# Patient Record
Sex: Female | Born: 1980 | Race: Black or African American | Hispanic: No | Marital: Single | Smoking: Never smoker
Health system: Southern US, Community
[De-identification: ages and names within clinical notes are randomized; demographics above are authoritative.]

## PROBLEM LIST (undated history)

## (undated) ENCOUNTER — Inpatient Hospital Stay (HOSPITAL_COMMUNITY): Payer: Self-pay

## (undated) DIAGNOSIS — B1910 Unspecified viral hepatitis B without hepatic coma: Secondary | ICD-10-CM

---

## 2015-05-25 NOTE — L&D Delivery Note (Signed)
Patient is a 35 y.o. now [id number] who presented with SOL, now s/p NSVD.   Delivery Note At 8:58 AM a viable female was delivered. Mother complete and pushing. Head delivered OA, with restitution to the left. Shoulder and body delivered in the usual fashion. Cord clamped and cut after 1 minute delay. Placenta delivered intact. Fundus firm. No lacerations noted.   APGAR: pending Weight : pending Placenta status: intact Cord: 3-vessel  Anesthesia:  n/a Episiotomy:  none Lacerations:  none Est. Blood Loss (mL):  100  Mom to postpartum.  Baby to Couplet care / Skin to Skin.  Kandra NicolasJulie P Degele 01/29/2016, 9:26 AM  Patient is a [id number] at 7187w2d who was admitted in active labor/transition, significant hx of prev C/S followed by VBAC x 2 and last onset of care @ 31wks. She progressed to SVD without augmentation, but membranes were AROMed prior to del.  I was gloved and present for delivery in its entirety.  Second stage of labor progressed, baby delivered after two contractions.  No decels during second stage noted.  Complications: none  Lacerations: none  EBL: 100cc  Given prophylactic cytotec 800mcg buccally.  Cam HaiSHAW, KIMBERLY, CNM 9:59 AM 01/29/2016

## 2015-06-14 ENCOUNTER — Emergency Department (HOSPITAL_COMMUNITY)
Admission: EM | Admit: 2015-06-14 | Discharge: 2015-06-15 | Disposition: A | Discharge status: Home / Self Care | Payer: Medicaid Other | Attending: Emergency Medicine | Admitting: Emergency Medicine

## 2015-06-14 ENCOUNTER — Encounter (HOSPITAL_COMMUNITY): Payer: Self-pay | Admitting: Emergency Medicine

## 2015-06-14 DIAGNOSIS — Z8619 Personal history of other infectious and parasitic diseases: Secondary | ICD-10-CM | POA: Diagnosis not present

## 2015-06-14 DIAGNOSIS — O9989 Other specified diseases and conditions complicating pregnancy, childbirth and the puerperium: Secondary | ICD-10-CM | POA: Insufficient documentation

## 2015-06-14 DIAGNOSIS — Z3490 Encounter for supervision of normal pregnancy, unspecified, unspecified trimester: Secondary | ICD-10-CM

## 2015-06-14 DIAGNOSIS — R1030 Lower abdominal pain, unspecified: Secondary | ICD-10-CM | POA: Diagnosis not present

## 2015-06-14 DIAGNOSIS — R102 Pelvic and perineal pain: Secondary | ICD-10-CM

## 2015-06-14 DIAGNOSIS — Z3A01 Less than 8 weeks gestation of pregnancy: Secondary | ICD-10-CM | POA: Diagnosis not present

## 2015-06-14 HISTORY — DX: Unspecified viral hepatitis B without hepatic coma: B19.10

## 2015-06-14 LAB — COMPREHENSIVE METABOLIC PANEL
ALBUMIN: 3.7 g/dL (ref 3.5–5.0)
ALK PHOS: 82 U/L (ref 38–126)
ALT: 11 U/L — AB (ref 14–54)
ANION GAP: 11 (ref 5–15)
AST: 16 U/L (ref 15–41)
BILIRUBIN TOTAL: 0.5 mg/dL (ref 0.3–1.2)
BUN: 11 mg/dL (ref 6–20)
CALCIUM: 9 mg/dL (ref 8.9–10.3)
CO2: 24 mmol/L (ref 22–32)
Chloride: 102 mmol/L (ref 101–111)
Creatinine, Ser: 0.84 mg/dL (ref 0.44–1.00)
GFR calc Af Amer: >60 mL/min (ref >60)
GFR calc non Af Amer: >60 mL/min (ref >60)
GLUCOSE: 101 mg/dL — AB (ref 65–99)
Potassium: 4.4 mmol/L (ref 3.5–5.1)
Sodium: 137 mmol/L (ref 135–145)
TOTAL PROTEIN: 6.9 g/dL (ref 6.5–8.1)

## 2015-06-14 LAB — URINALYSIS, ROUTINE W REFLEX MICROSCOPIC
Bilirubin Urine: NEGATIVE
GLUCOSE, UA: NEGATIVE mg/dL
Hgb urine dipstick: NEGATIVE
KETONES UR: NEGATIVE mg/dL
LEUKOCYTES UA: NEGATIVE
Nitrite: NEGATIVE
PH: 6 (ref 5.0–8.0)
Protein, ur: NEGATIVE mg/dL
Specific Gravity, Urine: 1.014 (ref 1.005–1.030)

## 2015-06-14 LAB — CBC WITH DIFFERENTIAL/PLATELET
BASOS PCT: 1 %
Basophils Absolute: 0 10*3/uL (ref 0.0–0.1)
Eosinophils Absolute: 0.9 10*3/uL — ABNORMAL HIGH (ref 0.0–0.7)
Eosinophils Relative: 11 %
HEMATOCRIT: 37.3 % (ref 36.0–46.0)
Hemoglobin: 12.8 g/dL (ref 12.0–15.0)
LYMPHS ABS: 3 10*3/uL (ref 0.7–4.0)
LYMPHS PCT: 38 %
MCH: 28.4 pg (ref 26.0–34.0)
MCHC: 34.3 g/dL (ref 30.0–36.0)
MCV: 82.7 fL (ref 78.0–100.0)
MONO ABS: 0.3 10*3/uL (ref 0.1–1.0)
MONOS PCT: 4 %
NEUTROS ABS: 3.6 10*3/uL (ref 1.7–7.7)
Neutrophils Relative %: 46 %
Platelets: 243 10*3/uL (ref 150–400)
RBC: 4.51 MIL/uL (ref 3.87–5.11)
RDW: 14.1 % (ref 11.5–15.5)
WBC: 7.8 10*3/uL (ref 4.0–10.5)

## 2015-06-14 LAB — POC URINE PREG, ED: PREG TEST UR: POSITIVE — AB

## 2015-06-14 LAB — I-STAT BETA HCG BLOOD, ED (MC, WL, AP ONLY): I-stat hCG, quantitative: >2000 m[IU]/mL — ABNORMAL HIGH (ref <5)

## 2015-06-14 LAB — HCG, QUANTITATIVE, PREGNANCY: hCG, Beta Chain, Quant, S: 31390 m[IU]/mL — ABNORMAL HIGH (ref <5)

## 2015-06-14 NOTE — ED Notes (Signed)
Per EMS, the patient is feeling weak, sudden onset. "feels week all over, but most weakness is in pelvic area". lmp was dec 13th. VS 114/78, p 82, rr 18, 99% o2 on room air, cbg is 99. Patient is from home. Patient speaks swahili.

## 2015-06-14 NOTE — ED Provider Notes (Signed)
CSN: [id number]     Arrival date & time 06/14/15  2127 History   First MD Initiated Contact with Patient 06/14/15 2147     Chief Complaint  Patient presents with  . Abdominal Pain     (Consider location/radiation/quality/duration/timing/severity/associated sxs/prior Treatment) Patient is a 35 y.o. female presenting with abdominal pain. The history is provided by the patient. The history is limited by a language barrier. A language interpreter was used.  Abdominal Pain Pain location:  Suprapubic, LLQ and RLQ Pain quality: aching   Pain radiates to:  Does not radiate Pain severity:  Moderate Onset quality:  Gradual Duration:  1 day Timing:  Constant Progression:  Unchanged Chronicity:  New Context: not recent illness, not sick contacts and not trauma   Relieved by:  None tried Worsened by:  Nothing tried Ineffective treatments:  None tried Associated symptoms: no constipation, no diarrhea, no nausea, no shortness of breath, no vaginal bleeding, no vaginal discharge and no vomiting   Risk factors comment:  LMP Dec 13   Past Medical History  Diagnosis Date  . Hepatitis B    Past Surgical History  Procedure Laterality Date  . Cesarean section     History reviewed. No pertinent family history. Social History  Substance Use Topics  . Smoking status: Never Smoker   . Smokeless tobacco: None  . Alcohol Use: No   OB History    No data available     Review of Systems  Constitutional: Negative.   HENT: Negative.   Eyes: Negative.   Respiratory: Negative for shortness of breath.   Cardiovascular: Negative.   Gastrointestinal: Positive for abdominal pain. Negative for nausea, vomiting, diarrhea and constipation.  Genitourinary: Negative.  Negative for vaginal bleeding and vaginal discharge.  Musculoskeletal: Negative.   Skin: Negative for pallor, rash and wound.  Neurological: Negative.       Allergies  Asa  Home Medications   Prior to Admission medications    Not on File   BP 153/98 mmHg  Pulse 104  Temp(Src) 98.8 F (37.1 C) (Oral)  Resp 20  SpO2 98%  LMP 05/06/2015 (Exact Date) Physical Exam  Constitutional: She is oriented to person, place, and time. She appears well-developed and well-nourished. No distress.  HENT:  Head: Normocephalic and atraumatic.  Mouth/Throat: No oropharyngeal exudate.  Eyes: Conjunctivae are normal. Pupils are equal, round, and reactive to light. No scleral icterus.  Neck: Normal range of motion. Neck supple.  Cardiovascular: Normal rate, regular rhythm and intact distal pulses.  Exam reveals friction rub. Exam reveals no gallop.   No murmur heard. Pulmonary/Chest: Effort normal and breath sounds normal. No respiratory distress. She has no wheezes. She has no rales. She exhibits no tenderness.  Abdominal: Soft. She exhibits no distension. There is tenderness (mild suprapubic ttp). There is no rebound and no guarding.  Musculoskeletal: Normal range of motion. She exhibits no edema or tenderness.  Neurological: She is alert and oriented to person, place, and time. No cranial nerve deficit. She exhibits normal muscle tone. Coordination normal.  Skin: Skin is warm and dry. No rash noted. She is not diaphoretic. No erythema. No pallor.  Psychiatric: She has a normal mood and affect.  Nursing note and vitals reviewed.   ED Course  Procedures (including critical care time) Labs Review Labs Reviewed  CBC WITH DIFFERENTIAL/PLATELET - Abnormal; Notable for the following:    Eosinophils Absolute 0.9 (*)    All other components within normal limits  COMPREHENSIVE METABOLIC PANEL - Abnormal;  Notable for the following:    Glucose, Bld 101 (*)    ALT 11 (*)    All other components within normal limits  HCG, QUANTITATIVE, PREGNANCY - Abnormal; Notable for the following:    hCG, Beta Chain, Quant, S 31390 (*)    All other components within normal limits  I-STAT BETA HCG BLOOD, ED ([facility], [facility], [facility] ONLY) - Abnormal; Notable  for the following:    I-stat hCG, quantitative >2000.0 (*)    All other components within normal limits  POC URINE PREG, ED - Abnormal; Notable for the following:    Preg Test, Ur POSITIVE (*)    All other components within normal limits  URINALYSIS, ROUTINE W REFLEX MICROSCOPIC (NOT AT [facility])    Imaging Review US Ob Comp Less 14 Wks  06/15/2015  CLINICAL DATA:  Acute onset of pelvic pain.  Initial encounter. EXAM: OBSTETRIC <14 WK [place] AND TRANSVAGINAL OB US TECHNIQUE: Both transabdominal and transvaginal ultrasound examinations were performed for complete evaluation of the gestation as well as the maternal uterus, adnexal regions, and pelvic cul-de-sac. Transvaginal technique was performed to assess early pregnancy. COMPARISON:  None. FINDINGS: Intrauterine gestational sac: Visualized/normal in shape. Yolk sac:  No Embryo:  No MSD: 1.45 cm   6 w   2  d Subchorionic hemorrhage: A small amount of subchorionic hemorrhage is noted. Maternal uterus/adnexae: The right ovary is unremarkable in appearance. The left ovary is not seen. Trace free fluid within the pelvic cul-de-sac is likely physiologic in nature. IMPRESSION: 1. Intrauterine gestational sac noted, with a mean sac diameter of 1.5 cm, corresponding to a gestational age of [redacted] weeks 2 days. This matches the gestational age of [redacted] weeks 5 days by LMP, reflecting an estimated date of delivery of February 10, 2016. No yolk sac or embryo is yet seen. Would perform follow-up pelvic ultrasound in 2 weeks, if quantitative beta HCG level continues to trend upward. 2. Small amount of subchorionic hemorrhage noted. 3. No evidence for ectopic pregnancy.  The left ovary is not seen. Electronically Signed   By: Roanna Raider M.D.   On: 06/15/2015 01:54   US Ob Transvaginal  06/15/2015  CLINICAL DATA:  Acute onset of pelvic pain.  Initial encounter. EXAM: OBSTETRIC <14 WK [place] AND TRANSVAGINAL OB US TECHNIQUE: Both transabdominal and transvaginal ultrasound  examinations were performed for complete evaluation of the gestation as well as the maternal uterus, adnexal regions, and pelvic cul-de-sac. Transvaginal technique was performed to assess early pregnancy. COMPARISON:  None. FINDINGS: Intrauterine gestational sac: Visualized/normal in shape. Yolk sac:  No Embryo:  No MSD: 1.45 cm   6 w   2  d Subchorionic hemorrhage: A small amount of subchorionic hemorrhage is noted. Maternal uterus/adnexae: The right ovary is unremarkable in appearance. The left ovary is not seen. Trace free fluid within the pelvic cul-de-sac is likely physiologic in nature. IMPRESSION: 1. Intrauterine gestational sac noted, with a mean sac diameter of 1.5 cm, corresponding to a gestational age of [redacted] weeks 2 days. This matches the gestational age of [redacted] weeks 5 days by LMP, reflecting an estimated date of delivery of February 10, 2016. No yolk sac or embryo is yet seen. Would perform follow-up pelvic ultrasound in 2 weeks, if quantitative beta HCG level continues to trend upward. 2. Small amount of subchorionic hemorrhage noted. 3. No evidence for ectopic pregnancy.  The left ovary is not seen. Electronically Signed   By: Roanna Raider M.D.   On: 06/15/2015 01:54  I have personally reviewed and evaluated these images and lab results as part of my medical decision-making.   EKG Interpretation None      MDM   Final diagnoses:  Lower abdominal pain  Pregnancy    Patient is a 35 year old female who presents with lower abdominal aching pain for 1 day duration. The patient's last menstrual period was December 13. Patient denies any vaginal bleeding or vaginal discharge. Patient denies any urinary symptoms, cough, fever, chest pain, or diarrhea.. Further history and exam as above notable for stable vital signs and mild abdominal tenderness throughout her lower abdomen benign. Patient has a positive pregnancy test but otherwise no leukocytosis and reassuring CMP. UA negative for UTI.  Patient did not know she was pregnant and thus has not received any prenatal care. We'll obtain OB ultrasound to confirm IUP and dating purposes.  Patient care handed off to Dr. Patria Mane. Await ob US. Patient to f/u with OBGYN as outpatient for routine prenatal care if IUP confirmed.    Marijean Niemann, MD 06/15/15 1142  Lavera Guise, MD 06/15/15 1255

## 2015-06-15 ENCOUNTER — Encounter (HOSPITAL_COMMUNITY): Payer: Self-pay | Admitting: Emergency Medicine

## 2015-06-15 ENCOUNTER — Emergency Department (HOSPITAL_COMMUNITY): Payer: Medicaid Other

## 2015-06-15 NOTE — ED Notes (Signed)
Patient iin ultrasound.

## 2015-06-15 NOTE — ED Provider Notes (Signed)
4:03 AM Long discussion had with pt with interpreter. Early pregnancy. Gestational sac noted. No ectopic. No free fluid.  Patient will need follow-up beta hCG as well as follow-up ultrasound.  This was explained to the patient.  She understands to return to [facility] ER for any new or worsening symptoms.  Ectopic precautions given.  Told to take a prenatal vitamin.  All questions answered.  Azalia Bilis, MD 06/15/15 [phone]

## 2015-06-15 NOTE — ED Notes (Signed)
Called ultrasound for ETA, one other patient ahead of this patient.

## 2015-06-15 NOTE — ED Notes (Signed)
Ultrasound contacted again as family is getting agitated from wait time. Patient is next in transport.

## 2015-06-15 NOTE — ED Provider Notes (Signed)
I saw and evaluated the patient, reviewed the resident's note and I agree with the findings and plan.   EKG Interpretation None      35 year old female who presents with low abdominal pain and weakness. LMP in early December and without vaginal bleeding or discharge. No fever, N/V/D, or urinary complaints. VSS and abdomen soft and benign. Positive pregnancy test. Pending OB US to confirm IUP in setting of mild low abdominal tenderness. Will refer for outpatient OB follow-up if IUP  Lavera Guise, MD 06/15/15 [phone]

## 2015-06-15 NOTE — ED Notes (Signed)
Visitor at nursing station asking for update, explained MD is to preform pelvic exam, awaiting his return. They acknowledge. Discussed with Dr. Patria Mane.

## 2015-06-15 NOTE — Discharge Instructions (Signed)
Abdominal Pain, Adult Many things can cause abdominal pain. Usually, abdominal pain is not caused by a disease and will improve without treatment. It can often be observed and treated at home. Your health care provider will do a physical exam and possibly order blood tests and X-rays to help determine the seriousness of your pain. However, in many cases, more time must pass before a clear cause of the pain can be found. Before that point, your health care provider may not know if you need more testing or further treatment. HOME CARE INSTRUCTIONS Monitor your abdominal pain for any changes. The following actions may help to alleviate any discomfort you are experiencing:  Only take over-the-counter or prescription medicines as directed by your health care provider.  Do not take laxatives unless directed to do so by your health care provider.  Try a clear liquid diet (broth, tea, or water) as directed by your health care provider. Slowly move to a bland diet as tolerated. SEEK MEDICAL CARE IF:  You have unexplained abdominal pain.  You have abdominal pain associated with nausea or diarrhea.  You have pain when you urinate or have a bowel movement.  You experience abdominal pain that wakes you in the night.  You have abdominal pain that is worsened or improved by eating food.  You have abdominal pain that is worsened with eating fatty foods.  You have a fever. SEEK IMMEDIATE MEDICAL CARE IF:  Your pain does not go away within 2 hours.  You keep throwing up (vomiting).  Your pain is felt only in portions of the abdomen, such as the right side or the left lower portion of the abdomen.  You pass bloody or black tarry stools. MAKE SURE YOU:  Understand these instructions.  Will watch your condition.  Will get help right away if you are not doing well or get worse.   This information is not intended to replace advice given to you by your health care provider. Make sure you discuss  any questions you have with your health care provider.   Document Released: 02/17/2005 Document Revised: 01/29/2015 Document Reviewed: 01/17/2013 Elsevier Interactive Patient Education 2016 Elsevier Inc.  Back Pain in Pregnancy Back pain during pregnancy is common. It happens in about half of all pregnancies. It is important for you and your baby that you remain active during your pregnancy.If you feel that back pain is not allowing you to remain active or sleep well, it is time to see your caregiver. Back pain may be caused by several factors related to changes during your pregnancy.Fortunately, unless you had trouble with your back before your pregnancy, the pain is likely to get better after you deliver. Low back pain usually occurs between the fifth and seventh months of pregnancy. It can, however, happen in the first couple months. Factors that increase the risk of back problems include:   Previous back problems.  Injury to your back.  Having twins or multiple births.  A chronic cough.  Stress.  Job-related repetitive motions.  Muscle or spinal disease in the back.  Family history of back problems, ruptured (herniated) discs, or osteoporosis.  Depression, anxiety, and panic attacks. CAUSES   When you are pregnant, your body produces a hormone called relaxin. This hormonemakes the ligaments connecting the low back and pubic bones more flexible. This flexibility allows the baby to be delivered more easily. When your ligaments are loose, your muscles need to work harder to support your back. Soreness in your back can  come from tired muscles. Soreness can also come from back tissues that are irritated since they are receiving less support.  As the baby grows, it puts pressure on the nerves and blood vessels in your pelvis. This can cause back pain.  As the baby grows and gets heavier during pregnancy, the uterus pushes the stomach muscles forward and changes your center of  gravity. This makes your back muscles work harder to maintain good posture. SYMPTOMS  Lumbar pain during pregnancy Lumbar pain during pregnancy usually occurs at or above the waist in the center of the back. There may be pain and numbness that radiates into your leg or foot. This is similar to low back pain experienced by non-pregnant women. It usually increases with sitting for long periods of time, standing, or repetitive lifting. Tenderness may also be present in the muscles along your upper back. Posterior pelvic pain during pregnancy Pain in the back of the pelvis is more common than lumbar pain in pregnancy. It is a deep pain felt in your side at the waistline, or across the tailbone (sacrum), or in both places. You may have pain on one or both sides. This pain can also go into the buttocks and backs of the upper thighs. Pubic and groin pain may also be present. The pain does not quickly resolve with rest, and morning stiffness may also be present. Pelvic pain during pregnancy can be brought on by most activities. A high level of fitness before and during pregnancy may or may not prevent this problem. Labor pain is usually 1 to 2 minutes apart, lasts for about 1 minute, and involves a bearing down feeling or pressure in your pelvis. However, if you are at term with the pregnancy, constant low back pain can be the beginning of early labor, and you should be aware of this. DIAGNOSIS  X-rays of the back should not be done during the first 12 to 14 weeks of the pregnancy and only when absolutely necessary during the rest of the pregnancy. MRIs do not give off radiation and are safe during pregnancy. MRIs also should only be done when absolutely necessary. HOME CARE INSTRUCTIONS  Exercise as directed by your caregiver. Exercise is the most effective way to prevent or manage back pain. If you have a back problem, it is especially important to avoid sports that require sudden body movements. Swimming and  walking are great activities.  Do not stand in one place for long periods of time.  Do not wear high heels.  Sit in chairs with good posture. Use a pillow on your lower back if necessary. Make sure your head rests over your shoulders and is not hanging forward.  Try sleeping on your side, preferably the left side, with a pillow or two between your legs. If you are sore after a night's rest, your bedmay betoo soft.Try placing a board between your mattress and box spring.  Listen to your body when lifting.If you are experiencing pain, ask for help or try bending yourknees more so you can use your leg muscles rather than your back muscles. Squat down when picking up something from the floor. Do not bend over.  Eat a healthy diet. Try to gain weight within your caregiver's recommendations.  Use heat or cold packs 3 to 4 times a day for 15 minutes to help with the pain.  Only take over-the-counter or prescription medicines for pain, discomfort, or fever as directed by your caregiver. Sudden (acute) back pain  Use  bed rest for only the most extreme, acute episodes of back pain. Prolonged bed rest over 48 hours will aggravate your condition.  Ice is very effective for acute conditions.  Put ice in a plastic bag.  Place a towel between your skin and the bag.  Leave the ice on for 10 to 20 minutes every 2 hours, or as needed.  Using heat packs for 30 minutes prior to activities is also helpful. Continued back pain See your caregiver if you have continued problems. Your caregiver can help or refer you for appropriate physical therapy. With conditioning, most back problems can be avoided. Sometimes, a more serious issue may be the cause of back pain. You should be seen right away if new problems seem to be developing. Your caregiver may recommend:  A maternity girdle.  An elastic sling.  A back brace.  A massage therapist or acupuncture. SEEK MEDICAL CARE IF:   You are not able to  do most of your daily activities, even when taking the pain medicine you were given.  You need a referral to a physical therapist or chiropractor.  You want to try acupuncture. SEEK IMMEDIATE MEDICAL CARE IF:  You develop numbness, tingling, weakness, or problems with the use of your arms or legs.  You develop severe back pain that is no longer relieved with medicines.  You have a sudden change in bowel or bladder control.  You have increasing pain in other areas of the body.  You develop shortness of breath, dizziness, or fainting.  You develop nausea, vomiting, or sweating.  You have back pain which is similar to labor pains.  You have back pain along with your water breaking or vaginal bleeding.  You have back pain or numbness that travels down your leg.  Your back pain developed after you fell.  You develop pain on one side of your back. You may have a kidney stone.  You see blood in your urine. You may have a bladder infection or kidney stone.  You have back pain with blisters. You may have shingles. Back pain is fairly common during pregnancy but should not be accepted as just part of the process. Back pain should always be treated as soon as possible. This will make your pregnancy as pleasant as possible.   This information is not intended to replace advice given to you by your health care provider. Make sure you discuss any questions you have with your health care provider.   Document Released: 08/18/2005 Document Revised: 08/02/2011 Document Reviewed: 09/29/2010 Elsevier Interactive Patient Education 2016 [employer].  First Trimester of Pregnancy The first trimester of pregnancy is from week 1 until the end of week 12 (months 1 through 3). A week after a sperm fertilizes an egg, the egg will implant on the wall of the uterus. This embryo will begin to develop into a baby. Genes from you and your partner are forming the baby. The female genes determine whether the  baby is a boy or a girl. At 6-8 weeks, the eyes and face are formed, and the heartbeat can be seen on ultrasound. At the end of 12 weeks, all the baby's organs are formed.  Now that you are pregnant, you will want to do everything you can to have a healthy baby. Two of the most important things are to get good prenatal care and to follow your health care provider's instructions. Prenatal care is all the medical care you receive before the baby's birth. This care  will help prevent, find, and treat any problems during the pregnancy and childbirth. BODY CHANGES Your body goes through many changes during pregnancy. The changes vary from woman to woman.   You may gain or lose a couple of pounds at first.  You may feel sick to your stomach (nauseous) and throw up (vomit). If the vomiting is uncontrollable, call your health care provider.  You may tire easily.  You may develop headaches that can be relieved by medicines approved by your health care provider.  You may urinate more often. Painful urination may mean you have a bladder infection.  You may develop heartburn as a result of your pregnancy.  You may develop constipation because certain hormones are causing the muscles that push waste through your intestines to slow down.  You may develop hemorrhoids or swollen, bulging veins (varicose veins).  Your breasts may begin to grow larger and become tender. Your nipples may stick out more, and the tissue that surrounds them (areola) may become darker.  Your gums may bleed and may be sensitive to brushing and flossing.  Dark spots or blotches (chloasma, mask of pregnancy) may develop on your face. This will likely fade after the baby is born.  Your menstrual periods will stop.  You may have a loss of appetite.  You may develop cravings for certain kinds of food.  You may have changes in your emotions from day to day, such as being excited to be pregnant or being concerned that something may  go wrong with the pregnancy and baby.  You may have more vivid and strange dreams.  You may have changes in your hair. These can include thickening of your hair, rapid growth, and changes in texture. Some women also have hair loss during or after pregnancy, or hair that feels dry or thin. Your hair will most likely return to normal after your baby is born. WHAT TO EXPECT AT YOUR PRENATAL VISITS During a routine prenatal visit:  You will be weighed to make sure you and the baby are growing normally.  Your blood pressure will be taken.  Your abdomen will be measured to track your baby's growth.  The fetal heartbeat will be listened to starting around week 10 or 12 of your pregnancy.  Test results from any previous visits will be discussed. Your health care provider may ask you:  How you are feeling.  If you are feeling the baby move.  If you have had any abnormal symptoms, such as leaking fluid, bleeding, severe headaches, or abdominal cramping.  If you are using any tobacco products, including cigarettes, chewing tobacco, and electronic cigarettes.  If you have any questions. Other tests that may be performed during your first trimester include:  Blood tests to find your blood type and to check for the presence of any previous infections. They will also be used to check for low iron levels (anemia) and Rh antibodies. Later in the pregnancy, blood tests for diabetes will be done along with other tests if problems develop.  Urine tests to check for infections, diabetes, or protein in the urine.  An ultrasound to confirm the proper growth and development of the baby.  An amniocentesis to check for possible genetic problems.  Fetal screens for spina bifida and Down syndrome.  You may need other tests to make sure you and the baby are doing well.  HIV (human immunodeficiency virus) testing. Routine prenatal testing includes screening for HIV, unless you choose not to have this  test. HOME CARE INSTRUCTIONS  Medicines  Follow your health care provider's instructions regarding medicine use. Specific medicines may be either safe or unsafe to take during pregnancy.  Take your prenatal vitamins as directed.  If you develop constipation, try taking a stool softener if your health care provider approves. Diet  Eat regular, well-balanced meals. Choose a variety of foods, such as meat or vegetable-based protein, fish, milk and low-fat dairy products, vegetables, fruits, and whole grain breads and cereals. Your health care provider will help you determine the amount of weight gain that is right for you.  Avoid raw meat and uncooked cheese. These carry germs that can cause birth defects in the baby.  Eating four or five small meals rather than three large meals a day may help relieve nausea and vomiting. If you start to feel nauseous, eating a few soda crackers can be helpful. Drinking liquids between meals instead of during meals also seems to help nausea and vomiting.  If you develop constipation, eat more high-fiber foods, such as fresh vegetables or fruit and whole grains. Drink enough fluids to keep your urine clear or pale yellow. Activity and Exercise  Exercise only as directed by your health care provider. Exercising will help you:  Control your weight.  Stay in shape.  Be prepared for labor and delivery.  Experiencing pain or cramping in the lower abdomen or low back is a good sign that you should stop exercising. Check with your health care provider before continuing normal exercises.  Try to avoid standing for long periods of time. Move your legs often if you must stand in one place for a long time.  Avoid heavy lifting.  Wear low-heeled shoes, and practice good posture.  You may continue to have sex unless your health care provider directs you otherwise. Relief of Pain or Discomfort  Wear a good support bra for breast tenderness.   Take warm sitz  baths to soothe any pain or discomfort caused by hemorrhoids. Use hemorrhoid cream if your health care provider approves.   Rest with your legs elevated if you have leg cramps or low back pain.  If you develop varicose veins in your legs, wear support hose. Elevate your feet for 15 minutes, 3-4 times a day. Limit salt in your diet. Prenatal Care  Schedule your prenatal visits by the twelfth week of pregnancy. They are usually scheduled monthly at first, then more often in the last 2 months before delivery.  Write down your questions. Take them to your prenatal visits.  Keep all your prenatal visits as directed by your health care provider. Safety  Wear your seat belt at all times when driving.  Make a list of emergency phone numbers, including numbers for family, friends, the hospital, and police and fire departments. General Tips  Ask your health care provider for a referral to a local prenatal education class. Begin classes no later than at the beginning of month 6 of your pregnancy.  Ask for help if you have counseling or nutritional needs during pregnancy. Your health care provider can offer advice or refer you to specialists for help with various needs.  Do not use hot tubs, steam rooms, or saunas.  Do not douche or use tampons or scented sanitary pads.  Do not cross your legs for long periods of time.  Avoid cat litter boxes and soil used by cats. These carry germs that can cause birth defects in the baby and possibly loss of the fetus by miscarriage  or stillbirth.  Avoid all smoking, herbs, alcohol, and medicines not prescribed by your health care provider. Chemicals in these affect the formation and growth of the baby.  Do not use any tobacco products, including cigarettes, chewing tobacco, and electronic cigarettes. If you need help quitting, ask your health care provider. You may receive counseling support and other resources to help you quit.  Schedule a dentist  appointment. At home, brush your teeth with a soft toothbrush and be gentle when you floss. SEEK MEDICAL CARE IF:   You have dizziness.  You have mild pelvic cramps, pelvic pressure, or nagging pain in the abdominal area.  You have persistent nausea, vomiting, or diarrhea.  You have a bad smelling vaginal discharge.  You have pain with urination.  You notice increased swelling in your face, hands, legs, or ankles. SEEK IMMEDIATE MEDICAL CARE IF:   You have a fever.  You are leaking fluid from your vagina.  You have spotting or bleeding from your vagina.  You have severe abdominal cramping or pain.  You have rapid weight gain or loss.  You vomit blood or material that looks like coffee grounds.  You are exposed to [place] measles and have never had them.  You are exposed to fifth disease or chickenpox.  You develop a severe headache.  You have shortness of breath.  You have any kind of trauma, such as from a fall or a car accident.   This information is not intended to replace advice given to you by your health care provider. Make sure you discuss any questions you have with your health care provider.   Document Released: 05/04/2001 Document Revised: 05/31/2014 Document Reviewed: 03/20/2013 Elsevier Interactive Patient Education [employer]

## 2015-12-04 ENCOUNTER — Encounter (HOSPITAL_COMMUNITY): Payer: Self-pay | Admitting: *Deleted

## 2015-12-04 ENCOUNTER — Inpatient Hospital Stay (HOSPITAL_COMMUNITY)
Admission: AD | Admit: 2015-12-04 | Discharge: 2015-12-04 | Disposition: A | Discharge status: Home / Self Care | Payer: Medicaid Other | Source: Ambulatory Visit | Attending: Obstetrics & Gynecology | Admitting: Obstetrics & Gynecology

## 2015-12-04 DIAGNOSIS — Z8619 Personal history of other infectious and parasitic diseases: Secondary | ICD-10-CM | POA: Diagnosis not present

## 2015-12-04 DIAGNOSIS — R42 Dizziness and giddiness: Secondary | ICD-10-CM | POA: Insufficient documentation

## 2015-12-04 DIAGNOSIS — Z3A30 30 weeks gestation of pregnancy: Secondary | ICD-10-CM | POA: Diagnosis not present

## 2015-12-04 DIAGNOSIS — R51 Headache: Secondary | ICD-10-CM | POA: Diagnosis not present

## 2015-12-04 DIAGNOSIS — O9989 Other specified diseases and conditions complicating pregnancy, childbirth and the puerperium: Secondary | ICD-10-CM

## 2015-12-04 DIAGNOSIS — O26893 Other specified pregnancy related conditions, third trimester: Secondary | ICD-10-CM | POA: Insufficient documentation

## 2015-12-04 DIAGNOSIS — R519 Headache, unspecified: Secondary | ICD-10-CM

## 2015-12-04 LAB — URINALYSIS, ROUTINE W REFLEX MICROSCOPIC
BILIRUBIN URINE: NEGATIVE
Glucose, UA: NEGATIVE mg/dL
HGB URINE DIPSTICK: NEGATIVE
KETONES UR: NEGATIVE mg/dL
Leukocytes, UA: NEGATIVE
NITRITE: NEGATIVE
Protein, ur: NEGATIVE mg/dL
SPECIFIC GRAVITY, URINE: 1.02 (ref 1.005–1.030)
pH: 5.5 (ref 5.0–8.0)

## 2015-12-04 NOTE — MAU Note (Signed)
Provider at bedside to evaluate patient with interpreter via [employer]. All need discussed and understood by patient. FHR 130 and stable. No contractions noted. Plan is to d/c pt home with a work for note. Carmelina DaneERRI L Jakhiya Brower, RN

## 2015-12-04 NOTE — MAU Note (Signed)
Pt was dizzy at work on Monday. ([place] Was [facility] to [facility]. [facility] to "see a doctor for her pregnancy. " Work need a clearece that she can work again. Pt has no symptoms now.

## 2015-12-04 NOTE — Discharge Instructions (Signed)

## 2015-12-04 NOTE — MAU Provider Note (Signed)
Chief Complaint:  Dizziness   First Provider Initiated Contact with Patient 12/04/15 0809     HPI  Mariah Henson is a 35 y.o. G1P0 at 1630w2dwho presents to maternity admissions reporting need for return to work note. 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   [facility] and dizziness on Monday, was told to come here for prenatal care.  States has no symptoms whatsoever today.  Feels well. She reports good fetal movement, denies LOF, vaginal bleeding, vaginal itching/burning, urinary symptoms, h/a, dizziness, n/v, diarrhea, constipation or fever/chills.  She denies headache, visual changes or RUQ abdominal pain.  Video interpretor used (Swahili)  RN note: Pt received to mau c/o having a headache and dizziness on Monday and she came to the hospital for that visit. Pt denies pain at this time. Pt requesting a note for work and that is her only desire at this time. TERRI L TAEUBER Pt was dizzy at work on Monday. [place] [facility] to be [facility]. [facility] to "see a doctor for her pregnancy. " Work need a clearece that she can work again. Pt has no symptoms now  Past Medical History: Past Medical History  Diagnosis Date  . Hepatitis B     Past obstetric history: OB History  Gravida Para Term Preterm AB SAB TAB Ectopic Multiple Living  1             # Outcome Date GA Lbr Len/2nd Weight Sex Delivery Anes PTL Lv  1 Current               Past Surgical History: Past Surgical History  Procedure Laterality Date  . Cesarean section      Family History: No family history on file.  Social History: Social History  Substance Use Topics  . Smoking status: Never Smoker   . Smokeless tobacco: Not on file  . Alcohol Use: No    Allergies:  Allergies  Allergen Reactions  . Asa [Aspirin] Itching and Rash    Meds:  No  prescriptions prior to admission    I have reviewed patient's Past Medical Hx, Surgical Hx, Family Hx, Social Hx, medications and allergies.   ROS:  Review of Systems  Constitutional: Negative for fever, chills and fatigue.  Respiratory: Negative for shortness of breath.   Gastrointestinal: Negative for abdominal pain.  Genitourinary: Negative for vaginal bleeding.  Neurological: Negative for dizziness, weakness and headaches.   Other systems negative  Physical Exam  Patient Vitals for the past 24 hrs:  BP Temp Pulse Resp SpO2 Weight  12/04/15 0724 - - - - 100 % -  12/04/15 0721 108/66 mmHg 98.6 F (37 C) 78 18 - 177 lb 1.9 oz (80.341 kg)   Constitutional: Well-developed, well-nourished female in no acute distress.  Cardiovascular: normal rate and rhythm Respiratory: normal effort, clear to auscultation bilaterally GI: Abd soft, non-tender, gravid appropriate for gestational age.   No rebound or guarding. MS: Extremities nontender, no edema, normal ROM Neurologic: Alert and oriented x 4.  GU: Neg CVAT.  PELVIC EXAM: deferred   FHT:  Baseline 140 , moderate variability, accelerations present, no decelerations Contractions:  Rare   Labs: Results for orders placed or performed during the hospital encounter of 12/04/15 (from the past 24 hour(s))  Urinalysis, Routine w reflex microscopic (not at [facility])  Status: None   Collection Time: 12/04/15  7:25 AM  Result Value Ref Range   Color, Urine YELLOW YELLOW   APPearance CLEAR CLEAR   Specific Gravity, Urine 1.020 1.005 - 1.030   pH 5.5 5.0 - 8.0   Glucose, UA NEGATIVE NEGATIVE mg/dL   Hgb urine dipstick NEGATIVE NEGATIVE   Bilirubin Urine NEGATIVE NEGATIVE   Ketones, ur NEGATIVE NEGATIVE mg/dL   Protein, ur NEGATIVE NEGATIVE mg/dL   Nitrite NEGATIVE NEGATIVE   Leukocytes, UA NEGATIVE NEGATIVE      Imaging:  No results found.  MAU Course/MDM:  NST reviewed   Assessment: No diagnosis found.  Plan: Discharge  home Letter given for work Preterm Labor precautions and fetal kick counts Follow up in Office for prenatal visits and recheck    Medication List    Notice    You have not been prescribed any medications.     Pt stable at time of discharge. Encouraged to return here or to other Urgent Care/ED if she develops worsening of symptoms, increase in pain, fever, or other concerning symptoms.      Wynelle Bourgeois CNM, MSN Certified Nurse-Midwife 12/04/2015 8:09 AM

## 2015-12-04 NOTE — MAU Note (Signed)
Pt received to mau c/o having a headache and dizziness on Monday and she came to the hospital for that visit. Pt denies pain at this time. Pt requesting a note for work and that is her only desire at this time. Keatyn Jawad L Ahtziri Jeffries

## 2015-12-08 ENCOUNTER — Encounter (HOSPITAL_COMMUNITY): Payer: Self-pay | Admitting: Obstetrics and Gynecology

## 2015-12-08 ENCOUNTER — Encounter: Payer: Self-pay | Admitting: Advanced Practice Midwife

## 2015-12-08 ENCOUNTER — Encounter: Payer: Self-pay | Admitting: Obstetrics and Gynecology

## 2015-12-08 ENCOUNTER — Ambulatory Visit (INDEPENDENT_AMBULATORY_CARE_PROVIDER_SITE_OTHER): Payer: Medicaid Other | Admitting: Obstetrics and Gynecology

## 2015-12-08 ENCOUNTER — Other Ambulatory Visit (HOSPITAL_COMMUNITY)
Admission: RE | Admit: 2015-12-08 | Discharge: 2015-12-08 | Disposition: A | Discharge status: Home / Self Care | Payer: BLUE CROSS/BLUE SHIELD | Source: Ambulatory Visit | Attending: Obstetrics and Gynecology | Admitting: Obstetrics and Gynecology

## 2015-12-08 VITALS — BP 100/72 | HR 73 | Wt 168.7 lb

## 2015-12-08 DIAGNOSIS — O0933 Supervision of pregnancy with insufficient antenatal care, third trimester: Secondary | ICD-10-CM

## 2015-12-08 DIAGNOSIS — Z3493 Encounter for supervision of normal pregnancy, unspecified, third trimester: Secondary | ICD-10-CM

## 2015-12-08 DIAGNOSIS — Z3490 Encounter for supervision of normal pregnancy, unspecified, unspecified trimester: Secondary | ICD-10-CM | POA: Insufficient documentation

## 2015-12-08 DIAGNOSIS — Z01419 Encounter for gynecological examination (general) (routine) without abnormal findings: Secondary | ICD-10-CM | POA: Diagnosis present

## 2015-12-08 DIAGNOSIS — O0930 Supervision of pregnancy with insufficient antenatal care, unspecified trimester: Secondary | ICD-10-CM

## 2015-12-08 DIAGNOSIS — Z98891 History of uterine scar from previous surgery: Secondary | ICD-10-CM | POA: Insufficient documentation

## 2015-12-08 DIAGNOSIS — Z1151 Encounter for screening for human papillomavirus (HPV): Secondary | ICD-10-CM | POA: Insufficient documentation

## 2015-12-08 DIAGNOSIS — O34219 Maternal care for unspecified type scar from previous cesarean delivery: Secondary | ICD-10-CM

## 2015-12-08 DIAGNOSIS — Z113 Encounter for screening for infections with a predominantly sexual mode of transmission: Secondary | ICD-10-CM | POA: Diagnosis present

## 2015-12-08 DIAGNOSIS — Z3483 Encounter for supervision of other normal pregnancy, third trimester: Secondary | ICD-10-CM

## 2015-12-08 LAB — CBC
HEMATOCRIT: 35.4 % (ref 35.0–45.0)
HEMOGLOBIN: 11.7 g/dL (ref 11.7–15.5)
MCH: 28.5 pg (ref 27.0–33.0)
MCHC: 33.1 g/dL (ref 32.0–36.0)
MCV: 86.3 fL (ref 80.0–100.0)
MPV: 9.6 fL (ref 7.5–12.5)
Platelets: 171 10*3/uL (ref 140–400)
RBC: 4.1 MIL/uL (ref 3.80–5.10)
RDW: 14.3 % (ref 11.0–15.0)
WBC: 5 10*3/uL (ref 3.8–10.8)

## 2015-12-08 LAB — POCT URINALYSIS DIP (DEVICE)
Glucose, UA: NEGATIVE mg/dL
HGB URINE DIPSTICK: NEGATIVE
KETONES UR: NEGATIVE mg/dL
LEUKOCYTES UA: NEGATIVE
NITRITE: NEGATIVE
PH: 6 (ref 5.0–8.0)
Protein, ur: NEGATIVE mg/dL
SPECIFIC GRAVITY, URINE: 1.015 (ref 1.005–1.030)
Urobilinogen, UA: 1 mg/dL (ref 0.0–1.0)

## 2015-12-08 NOTE — Progress Notes (Signed)
Patient plans to breastfeed.

## 2015-12-08 NOTE — Progress Notes (Signed)
Mariah Henson is a 35 y.o. [id number] at 638w6d being seen today for initial prenatal care.  She is currently monitored for the following issues for this low-risk pregnancy and  does not have a problem list on file. She is establishing care today at 30wks. She has no chronic medical problems she has not been taking pre-natal vitamins to date. She did have an early US for dating. She has history of c-section but has had 2 vaginal births since.   Patient reports no complaints. She has no bleeding, pain or contractions. She has positive fetal movement.  Denies leaking of fluid.   The following portions of the patient's history were reviewed and updated as appropriate: allergies, current medications, past family history, past medical history, past social history, past surgical history and problem list. Problem list updated.  Objective:  There were no vitals filed for this visit.  Fetal Status:           General:  Alert, oriented and cooperative. Patient is in no acute distress.  Skin: Skin is warm and dry. No rash noted.   Cardiovascular: Normal heart rate noted  Respiratory: Normal respiratory effort, no problems with respiration noted  Abdomen: Soft, gravid, appropriate for gestational age.       Pelvic:  Cervical exam deferred        Extremities: Normal range of motion.     Mental Status: Normal mood and affect. Normal behavior. Normal judgment and thought content.   Urinalysis:      Assessment and Plan:  Pregnancy: [id number] at 598w6d  Supervision of normal pregnancy, antepartum, third trimester - Plan: Glucose Tolerance, 1 HR (50g) w/o Fasting, Prenatal Profile, Hemoglobinopathy Evaluation, GC/Chlamydia probe amp ([place], Cytology - PAP, Culture, OB Urine, Pain Mgmt, Profile 6 Conf w/o mM, U, US MFM OB COMP + 14 WK, ABO, Antibody screen, Rh Type, Hepatitis B surface antigen, CBC, Rubella antibody, IgM, RPR, CANCELED: Glucose Tolerance, 1 HR (50g) ([facility]), CANCELED: Culture, OB  Urine, CANCELED: GC/Chlamydia Probe Amp (genital or urine) ([facility] / [facility]), CANCELED: Cytology - PAP ([place]), CANCELED: US OB Comp + 14 Wk  Supervision of low-risk pregnancy, third trimester  History of C-section  Late prenatal care affecting pregnancy  Prenatal vitamins Plans for [place] ordered Pre-natal labs . Preterm labor symptoms and general obstetric precautions including but not limited to vaginal bleeding, contractions, leaking of fluid and fetal movement were reviewed in detail with the patient. Please refer to After Visit Summary for other counseling recommendations.  No Follow-up on file.   Lorne SkeensNicholas Michael Schenk, MD

## 2015-12-08 NOTE — Progress Notes (Deleted)
Subjective:  Mariah Henson is a 35 y.o. [id number] at 6454w6d being seen today for initial prenatal care.  She is currently monitored for the following issues for this low-risk pregnancy and  does not have a problem list on file. She is establishing care today at 30wks. She has no chronic medical problems she has not been taking pre-natal vitamins to date. She did have an early US for dating. She has history of c-section but has had 2 vaginal births since.   Patient reports no complaints. She has no bleeding, pain or contractions. She has positive fetal movement.  Denies leaking of fluid.   The following portions of the patient's history were reviewed and updated as appropriate: allergies, current medications, past family history, past medical history, past social history, past surgical history and problem list. Problem list updated.  Objective:  There were no vitals filed for this visit.  Fetal Status:           General:  Alert, oriented and cooperative. Patient is in no acute distress.  Skin: Skin is warm and dry. No rash noted.   Cardiovascular: Normal heart rate noted  Respiratory: Normal respiratory effort, no problems with respiration noted  Abdomen: Soft, gravid, appropriate for gestational age.       Pelvic:  Cervical exam deferred        Extremities: Normal range of motion.     Mental Status: Normal mood and affect. Normal behavior. Normal judgment and thought content.   Urinalysis:      Assessment and Plan:  Pregnancy: [id number] at 7354w6d  There are no diagnoses linked to this encounter. Preterm labor symptoms and general obstetric precautions including but not limited to vaginal bleeding, contractions, leaking of fluid and fetal movement were reviewed in detail with the patient. Please refer to After Visit Summary for other counseling recommendations.  No Follow-up on file.   Lorne SkeensNicholas Michael Mariah Kurdziel, MD

## 2015-12-08 NOTE — Patient Instructions (Signed)
Third Trimester of Pregnancy The third trimester is from week 29 through week 42, months 7 through 9. The third trimester is a time when the fetus is growing rapidly. At the end of the ninth month, the fetus is about 20 inches in length and weighs 6-10 pounds.  BODY CHANGES Your body goes through many changes during pregnancy. The changes vary from woman to woman.   Your weight will continue to increase. You can expect to gain 25-35 pounds (11-16 kg) by the end of the pregnancy.  You may begin to get stretch marks on your hips, abdomen, and breasts.  You may urinate more often because the fetus is moving lower into your pelvis and pressing on your bladder.  You may develop or continue to have heartburn as a result of your pregnancy.  You may develop constipation because certain hormones are causing the muscles that push waste through your intestines to slow down.  You may develop hemorrhoids or swollen, bulging veins (varicose veins).  You may have pelvic pain because of the weight gain and pregnancy hormones relaxing your joints between the bones in your pelvis. Backaches may result from overexertion of the muscles supporting your posture.  You may have changes in your hair. These can include thickening of your hair, rapid growth, and changes in texture. Some women also have hair loss during or after pregnancy, or hair that feels dry or thin. Your hair will most likely return to normal after your baby is born.  Your breasts will continue to grow and be tender. A yellow discharge may leak from your breasts called colostrum.  Your belly button may stick out.  You may feel short of breath because of your expanding uterus.  You may notice the fetus "dropping," or moving lower in your abdomen.  You may have a bloody mucus discharge. This usually occurs a few days to a week before labor begins.  Your cervix becomes thin and soft (effaced) near your due date. WHAT TO EXPECT AT YOUR  PRENATAL EXAMS  You will have prenatal exams every 2 weeks until week 36. Then, you will have weekly prenatal exams. During a routine prenatal visit:  You will be weighed to make sure you and the fetus are growing normally.  Your blood pressure is taken.  Your abdomen will be measured to track your baby's growth.  The fetal heartbeat will be listened to.  Any test results from the previous visit will be discussed.  You may have a cervical check near your due date to see if you have effaced. At around 36 weeks, your caregiver will check your cervix. At the same time, your caregiver will also perform a test on the secretions of the vaginal tissue. This test is to determine if a type of bacteria, Group B streptococcus, is present. Your caregiver will explain this further. Your caregiver may ask you:  What your birth plan is.  How you are feeling.  If you are feeling the baby move.  If you have had any abnormal symptoms, such as leaking fluid, bleeding, severe headaches, or abdominal cramping.  If you are using any tobacco products, including cigarettes, chewing tobacco, and electronic cigarettes.  If you have any questions. Other tests or screenings that may be performed during your third trimester include:  Blood tests that check for low iron levels (anemia).  Fetal testing to check the health, activity level, and growth of the fetus. Testing is done if you have certain medical conditions or if   there are problems during the pregnancy.  HIV (human immunodeficiency virus) testing. If you are at high risk, you may be screened for HIV during your third trimester of pregnancy. FALSE LABOR You may feel small, irregular contractions that eventually go away. These are called Braxton Hicks contractions, or false labor. Contractions may last for hours, days, or even weeks before true labor sets in. If contractions come at regular intervals, intensify, or become painful, it is best to be seen  by your caregiver.  SIGNS OF LABOR   Menstrual-like cramps.  Contractions that are 5 minutes apart or less.  Contractions that start on the top of the uterus and spread down to the lower abdomen and back.  A sense of increased pelvic pressure or back pain.  A watery or bloody mucus discharge that comes from the vagina. If you have any of these signs before the 37th week of pregnancy, call your caregiver right away. You need to go to the hospital to get checked immediately. HOME CARE INSTRUCTIONS   Avoid all smoking, herbs, alcohol, and unprescribed drugs. These chemicals affect the formation and growth of the baby.  Do not use any tobacco products, including cigarettes, chewing tobacco, and electronic cigarettes. If you need help quitting, ask your health care provider. You may receive counseling support and other resources to help you quit.  Follow your caregiver's instructions regarding medicine use. There are medicines that are either safe or unsafe to take during pregnancy.  Exercise only as directed by your caregiver. Experiencing uterine cramps is a good sign to stop exercising.  Continue to eat regular, healthy meals.  Wear a good support bra for breast tenderness.  Do not use hot tubs, steam rooms, or saunas.  Wear your seat belt at all times when driving.  Avoid raw meat, uncooked cheese, cat litter boxes, and soil used by cats. These carry germs that can cause birth defects in the baby.  Take your prenatal vitamins.  Take 1500-2000 mg of calcium daily starting at the 20th week of pregnancy until you deliver your baby.  Try taking a stool softener (if your caregiver approves) if you develop constipation. Eat more high-fiber foods, such as fresh vegetables or fruit and whole grains. Drink plenty of fluids to keep your urine clear or pale yellow.  Take warm sitz baths to soothe any pain or discomfort caused by hemorrhoids. Use hemorrhoid cream if your caregiver  approves.  If you develop varicose veins, wear support hose. Elevate your feet for 15 minutes, 3-4 times a day. Limit salt in your diet.  Avoid heavy lifting, wear low heal shoes, and practice good posture.  Rest a lot with your legs elevated if you have leg cramps or low back pain.  Visit your dentist if you have not gone during your pregnancy. Use a soft toothbrush to brush your teeth and be gentle when you floss.  A sexual relationship may be continued unless your caregiver directs you otherwise.  Do not travel far distances unless it is absolutely necessary and only with the approval of your caregiver.  Take prenatal classes to understand, practice, and ask questions about the labor and delivery.  Make a trial run to the hospital.  Pack your hospital bag.  Prepare the baby's nursery.  Continue to go to all your prenatal visits as directed by your caregiver. SEEK MEDICAL CARE IF:  You are unsure if you are in labor or if your water has broken.  You have dizziness.  You have   mild pelvic cramps, pelvic pressure, or nagging pain in your abdominal area.  You have persistent nausea, vomiting, or diarrhea.  You have a bad smelling vaginal discharge.  You have pain with urination. SEEK IMMEDIATE MEDICAL CARE IF:   You have a fever.  You are leaking fluid from your vagina.  You have spotting or bleeding from your vagina.  You have severe abdominal cramping or pain.  You have rapid weight loss or gain.  You have shortness of breath with chest pain.  You notice sudden or extreme swelling of your face, hands, ankles, feet, or legs.  You have not felt your baby move in over an hour.  You have severe headaches that do not go away with medicine.  You have vision changes.   This information is not intended to replace advice given to you by your health care provider. Make sure you discuss any questions you have with your health care provider.   Document Released:  05/04/2001 Document Revised: 05/31/2014 Document Reviewed: 07/11/2012 Elsevier Interactive Patient Education 2016 [employer]  Breastfeeding Deciding to breastfeed is one of the best choices you can make for you and your baby. A change in hormones during pregnancy causes your breast tissue to grow and increases the number and size of your milk ducts. These hormones also allow proteins, sugars, and fats from your blood supply to make breast milk in your milk-producing glands. Hormones prevent breast milk from being released before your baby is born as well as prompt milk flow after birth. Once breastfeeding has begun, thoughts of your baby, as well as his or her sucking or crying, can stimulate the release of milk from your milk-producing glands.  BENEFITS OF BREASTFEEDING For Your Baby  Your first milk (colostrum) helps your baby's digestive system function better.  There are antibodies in your milk that help your baby fight off infections.  Your baby has a lower incidence of asthma, allergies, and sudden infant death syndrome.  The nutrients in breast milk are better for your baby than infant formulas and are designed uniquely for your baby's needs.  Breast milk improves your baby's brain development.  Your baby is less likely to develop other conditions, such as childhood obesity, asthma, or type 2 diabetes mellitus. For You  Breastfeeding helps to create a very special bond between you and your baby.  Breastfeeding is convenient. Breast milk is always available at the correct temperature and costs nothing.  Breastfeeding helps to burn calories and helps you lose the weight gained during pregnancy.  Breastfeeding makes your uterus contract to its prepregnancy size faster and slows bleeding (lochia) after you give birth.   Breastfeeding helps to lower your risk of developing type 2 diabetes mellitus, osteoporosis, and breast or ovarian cancer later in life. SIGNS THAT YOUR BABY IS  HUNGRY Early Signs of Hunger  Increased alertness or activity.  Stretching.  Movement of the head from side to side.  Movement of the head and opening of the mouth when the corner of the mouth or cheek is stroked (rooting).  Increased sucking sounds, smacking lips, cooing, sighing, or squeaking.  Hand-to-mouth movements.  Increased sucking of fingers or hands. Late Signs of Hunger  Fussing.  Intermittent crying. Extreme Signs of Hunger Signs of extreme hunger will require calming and consoling before your baby will be able to breastfeed successfully. Do not wait for the following signs of extreme hunger to occur before you initiate breastfeeding:  Restlessness.  A loud, strong cry.  Screaming.   BREASTFEEDING BASICS Breastfeeding Initiation  Find a comfortable place to sit or lie down, with your neck and back well supported.  Place a pillow or rolled up blanket under your baby to bring him or her to the level of your breast (if you are seated). Nursing pillows are specially designed to help support your arms and your baby while you breastfeed.  Make sure that your baby's abdomen is facing your abdomen.  Gently massage your breast. With your fingertips, massage from your chest wall toward your nipple in a circular motion. This encourages milk flow. You may need to continue this action during the feeding if your milk flows slowly.  Support your breast with 4 fingers underneath and your thumb above your nipple. Make sure your fingers are well away from your nipple and your baby's mouth.  Stroke your baby's lips gently with your finger or nipple.  When your baby's mouth is open wide enough, quickly bring your baby to your breast, placing your entire nipple and as much of the colored area around your nipple (areola) as possible into your baby's mouth.  More areola should be visible above your baby's upper lip than below the lower lip.  Your baby's tongue should be between his  or her lower gum and your breast.  Ensure that your baby's mouth is correctly positioned around your nipple (latched). Your baby's lips should create a seal on your breast and be turned out (everted).  It is common for your baby to suck about 2-3 minutes in order to start the flow of breast milk. Latching Teaching your baby how to latch on to your breast properly is very important. An improper latch can cause nipple pain and decreased milk supply for you and poor weight gain in your baby. Also, if your baby is not latched onto your nipple properly, he or she may swallow some air during feeding. This can make your baby fussy. Burping your baby when you switch breasts during the feeding can help to get rid of the air. However, teaching your baby to latch on properly is still the best way to prevent fussiness from swallowing air while breastfeeding. Signs that your baby has successfully latched on to your nipple:  Silent tugging or silent sucking, without causing you pain.  Swallowing heard between every 3-4 sucks.  Muscle movement above and in front of his or her ears while sucking. Signs that your baby has not successfully latched on to nipple:  Sucking sounds or smacking sounds from your baby while breastfeeding.  Nipple pain. If you think your baby has not latched on correctly, slip your finger into the corner of your baby's mouth to break the suction and place it between your baby's gums. Attempt breastfeeding initiation again. Signs of Successful Breastfeeding Signs from your baby:  A gradual decrease in the number of sucks or complete cessation of sucking.  Falling asleep.  Relaxation of his or her body.  Retention of a small amount of milk in his or her mouth.  Letting go of your breast by himself or herself. Signs from you:  Breasts that have increased in firmness, weight, and size 1-3 hours after feeding.  Breasts that are softer immediately after  breastfeeding.  Increased milk volume, as well as a change in milk consistency and color by the fifth day of breastfeeding.  Nipples that are not sore, cracked, or bleeding. Signs That Your Baby is Getting Enough Milk  Wetting at least 3 diapers in a 24-hour period.   The urine should be clear and pale yellow by age 5 days.  At least 3 stools in a 24-hour period by age 5 days. The stool should be soft and yellow.  At least 3 stools in a 24-hour period by age 7 days. The stool should be seedy and yellow.  No loss of weight greater than 10% of birth weight during the first 3 days of age.  Average weight gain of 4-7 ounces (113-198 g) per week after age 4 days.  Consistent daily weight gain by age 5 days, without weight loss after the age of 2 weeks. After a feeding, your baby may spit up a small amount. This is common. BREASTFEEDING FREQUENCY AND DURATION Frequent feeding will help you make more milk and can prevent sore nipples and breast engorgement. Breastfeed when you feel the need to reduce the fullness of your breasts or when your baby shows signs of hunger. This is called "breastfeeding on demand." Avoid introducing a pacifier to your baby while you are working to establish breastfeeding (the first 4-6 weeks after your baby is born). After this time you may choose to use a pacifier. Research has shown that pacifier use during the first year of a baby's life decreases the risk of sudden infant death syndrome (SIDS). Allow your baby to feed on each breast as long as he or she wants. Breastfeed until your baby is finished feeding. When your baby unlatches or falls asleep while feeding from the first breast, offer the second breast. Because newborns are often sleepy in the first few weeks of life, you may need to awaken your baby to get him or her to feed. Breastfeeding times will vary from baby to baby. However, the following rules can serve as a guide to help you ensure that your baby is  properly fed:  Newborns (babies 4 weeks of age or younger) may breastfeed every 1-3 hours.  Newborns should not go longer than 3 hours during the day or 5 hours during the night without breastfeeding.  You should breastfeed your baby a minimum of 8 times in a 24-hour period until you begin to introduce solid foods to your baby at around 6 months of age. BREAST MILK PUMPING Pumping and storing breast milk allows you to ensure that your baby is exclusively fed your breast milk, even at times when you are unable to breastfeed. This is especially important if you are going back to work while you are still breastfeeding or when you are not able to be present during feedings. Your lactation consultant can give you guidelines on how long it is safe to store breast milk. A breast pump is a machine that allows you to pump milk from your breast into a sterile bottle. The pumped breast milk can then be stored in a refrigerator or freezer. Some breast pumps are operated by hand, while others use electricity. Ask your lactation consultant which type will work best for you. Breast pumps can be purchased, but some hospitals and breastfeeding support groups lease breast pumps on a monthly basis. A lactation consultant can teach you how to hand express breast milk, if you prefer not to use a pump. CARING FOR YOUR BREASTS WHILE YOU BREASTFEED Nipples can become dry, cracked, and sore while breastfeeding. The following recommendations can help keep your breasts moisturized and healthy:  Avoid using soap on your nipples.  Wear a supportive bra. Although not required, special nursing bras and tank tops are designed to allow access to your   breasts for breastfeeding without taking off your entire bra or top. Avoid wearing underwire-style bras or extremely tight bras.  Air dry your nipples for 3-4minutes after each feeding.  Use only cotton bra pads to absorb leaked breast milk. Leaking of breast milk between feedings  is normal.  Use lanolin on your nipples after breastfeeding. Lanolin helps to maintain your skin's normal moisture barrier. If you use pure lanolin, you do not need to wash it off before feeding your baby again. Pure lanolin is not toxic to your baby. You may also hand express a few drops of breast milk and gently massage that milk into your nipples and allow the milk to air dry. In the first few weeks after giving birth, some women experience extremely full breasts (engorgement). Engorgement can make your breasts feel heavy, warm, and tender to the touch. Engorgement peaks within 3-5 days after you give birth. The following recommendations can help ease engorgement:  Completely empty your breasts while breastfeeding or pumping. You may want to start by applying warm, moist heat (in the shower or with warm water-soaked hand towels) just before feeding or pumping. This increases circulation and helps the milk flow. If your baby does not completely empty your breasts while breastfeeding, pump any extra milk after he or she is finished.  Wear a snug bra (nursing or regular) or tank top for 1-2 days to signal your body to slightly decrease milk production.  Apply ice packs to your breasts, unless this is too uncomfortable for you.  Make sure that your baby is latched on and positioned properly while breastfeeding. If engorgement persists after 48 hours of following these recommendations, contact your health care provider or a lactation consultant. OVERALL HEALTH CARE RECOMMENDATIONS WHILE BREASTFEEDING  Eat healthy foods. Alternate between meals and snacks, eating 3 of each per day. Because what you eat affects your breast milk, some of the foods may make your baby more irritable than usual. Avoid eating these foods if you are sure that they are negatively affecting your baby.  Drink milk, fruit juice, and water to satisfy your thirst (about 10 glasses a day).  Rest often, relax, and continue to take  your prenatal vitamins to prevent fatigue, stress, and anemia.  Continue breast self-awareness checks.  Avoid chewing and smoking tobacco. Chemicals from cigarettes that pass into breast milk and exposure to secondhand smoke may harm your baby.  Avoid alcohol and drug use, including marijuana. Some medicines that may be harmful to your baby can pass through breast milk. It is important to ask your health care provider before taking any medicine, including all over-the-counter and prescription medicine as well as vitamin and herbal supplements. It is possible to become pregnant while breastfeeding. If birth control is desired, ask your health care provider about options that will be safe for your baby. SEEK MEDICAL CARE IF:  You feel like you want to stop breastfeeding or have become frustrated with breastfeeding.  You have painful breasts or nipples.  Your nipples are cracked or bleeding.  Your breasts are red, tender, or warm.  You have a swollen area on either breast.  You have a fever or chills.  You have nausea or vomiting.  You have drainage other than breast milk from your nipples.  Your breasts do not become full before feedings by the fifth day after you give birth.  You feel sad and depressed.  Your baby is too sleepy to eat well.  Your baby is having trouble sleeping.     Your baby is wetting less than 3 diapers in a 24-hour period.  Your baby has less than 3 stools in a 24-hour period.  Your baby's skin or the white part of his or her eyes becomes yellow.   Your baby is not gaining weight by 5 days of age. SEEK IMMEDIATE MEDICAL CARE IF:  Your baby is overly tired (lethargic) and does not want to wake up and feed.  Your baby develops an unexplained fever.   This information is not intended to replace advice given to you by your health care provider. Make sure you discuss any questions you have with your health care provider.   Document Released: 05/10/2005  Document Revised: 01/29/2015 Document Reviewed: 11/01/2012 Elsevier Interactive Patient Education 2016 [employer]  

## 2015-12-08 NOTE — Addendum Note (Signed)
Addended by: Loletha CarrowSCHENK, NICHOLAS M on: 12/08/2015 11:34 AM   Modules accepted: Level of Service

## 2015-12-09 LAB — CYTOLOGY - PAP

## 2015-12-09 LAB — GC/CHLAMYDIA PROBE AMP (~~LOC~~) NOT AT ARMC
CHLAMYDIA, DNA PROBE: NEGATIVE
NEISSERIA GONORRHEA: NEGATIVE

## 2015-12-09 LAB — GLUCOSE TOLERANCE, 1 HOUR (50G) W/O FASTING: Glucose, 1 Hr, gestational: 90 mg/dL (ref <140)

## 2015-12-10 LAB — CULTURE, OB URINE: Organism ID, Bacteria: 10000

## 2015-12-10 LAB — HEMOGLOBINOPATHY EVALUATION
HEMATOCRIT: 35.4 % (ref 35.0–45.0)
HEMOGLOBIN: 11.7 g/dL (ref 11.7–15.5)
Hgb A2 Quant: 4.3 % — ABNORMAL HIGH (ref 1.8–3.5)
Hgb A: 57.1 % — ABNORMAL LOW (ref >96.0)
Hgb S Quant: 37.6 % — ABNORMAL HIGH
MCH: 28.5 pg (ref 27.0–33.0)
MCV: 86.3 fL (ref 80.0–100.0)
RBC: 4.1 MIL/uL (ref 3.80–5.10)
RDW: 14.3 % (ref 11.0–15.0)

## 2015-12-11 ENCOUNTER — Encounter: Payer: Self-pay | Admitting: Obstetrics and Gynecology

## 2015-12-11 DIAGNOSIS — D573 Sickle-cell trait: Secondary | ICD-10-CM | POA: Insufficient documentation

## 2015-12-11 DIAGNOSIS — R768 Other specified abnormal immunological findings in serum: Secondary | ICD-10-CM | POA: Insufficient documentation

## 2015-12-11 LAB — PRENATAL PROFILE (SOLSTAS)
Antibody Screen: POSITIVE — AB
BASOS PCT: 1 %
Basophils Absolute: 50 cells/uL (ref 0–200)
Eosinophils Absolute: 450 cells/uL (ref 15–500)
Eosinophils Relative: 9 %
HEMATOCRIT: 35.4 % (ref 35.0–45.0)
HEP B S AG: NEGATIVE
HIV: NONREACTIVE
Hemoglobin: 11.7 g/dL (ref 11.7–15.5)
LYMPHS ABS: 2000 {cells}/uL (ref 850–3900)
LYMPHS PCT: 40 %
MCH: 28.5 pg (ref 27.0–33.0)
MCHC: 33.1 g/dL (ref 32.0–36.0)
MCV: 86.3 fL (ref 80.0–100.0)
MONO ABS: 300 {cells}/uL (ref 200–950)
MPV: 9.6 fL (ref 7.5–12.5)
Monocytes Relative: 6 %
Neutro Abs: 2200 cells/uL (ref 1500–7800)
Neutrophils Relative %: 44 %
Platelets: 171 10*3/uL (ref 140–400)
RBC: 4.1 MIL/uL (ref 3.80–5.10)
RDW: 14.3 % (ref 11.0–15.0)
RH TYPE: POSITIVE
Rubella: 12.4 Index — ABNORMAL HIGH (ref <0.90)
WBC: 5 10*3/uL (ref 3.8–10.8)

## 2015-12-11 LAB — PAIN MGMT, PROFILE 6 CONF W/O MM, U
6 Acetylmorphine: NEGATIVE ng/mL (ref <10)
AMPHETAMINES: NEGATIVE ng/mL (ref <500)
Alcohol Metabolites: NEGATIVE ng/mL (ref <500)
BARBITURATES: NEGATIVE ng/mL (ref <300)
Benzodiazepines: NEGATIVE ng/mL (ref <100)
Cocaine Metabolite: NEGATIVE ng/mL (ref <150)
Creatinine: 183 mg/dL (ref >=20.0)
METHADONE METABOLITE: NEGATIVE ng/mL (ref <100)
Marijuana Metabolite: NEGATIVE ng/mL (ref <20)
OPIATES: NEGATIVE ng/mL (ref <100)
OXYCODONE: NEGATIVE ng/mL (ref <100)
Oxidant: NEGATIVE ug/mL (ref <200)
Phencyclidine: NEGATIVE ng/mL (ref <25)
Please note:: 0
pH: 7.1 (ref 4.5–9.0)

## 2015-12-11 LAB — BLOOD TYPING, RBC ANTIGENS
ANTIGEN TYPING (2): NEGATIVE
Antigen Typing: NEGATIVE

## 2015-12-11 LAB — RUBELLA ANTIBODY, IGM

## 2015-12-11 LAB — PRENATAL ANTIBODY IDENTIFICATION

## 2015-12-12 ENCOUNTER — Ambulatory Visit (HOSPITAL_COMMUNITY): Payer: Medicaid Other

## 2015-12-15 ENCOUNTER — Other Ambulatory Visit: Payer: Self-pay | Admitting: Obstetrics and Gynecology

## 2015-12-15 ENCOUNTER — Ambulatory Visit (HOSPITAL_COMMUNITY)
Admission: RE | Admit: 2015-12-15 | Discharge: 2015-12-15 | Disposition: A | Discharge status: Home / Self Care | Payer: Medicaid Other | Source: Ambulatory Visit | Attending: Obstetrics and Gynecology | Admitting: Obstetrics and Gynecology

## 2015-12-15 DIAGNOSIS — O98419 Viral hepatitis complicating pregnancy, unspecified trimester: Secondary | ICD-10-CM

## 2015-12-15 DIAGNOSIS — Z36 Encounter for antenatal screening of mother: Secondary | ICD-10-CM | POA: Insufficient documentation

## 2015-12-15 DIAGNOSIS — Z3A31 31 weeks gestation of pregnancy: Secondary | ICD-10-CM | POA: Diagnosis not present

## 2015-12-15 DIAGNOSIS — Z3483 Encounter for supervision of other normal pregnancy, third trimester: Secondary | ICD-10-CM

## 2015-12-15 DIAGNOSIS — O0933 Supervision of pregnancy with insufficient antenatal care, third trimester: Secondary | ICD-10-CM

## 2015-12-15 DIAGNOSIS — Z3689 Encounter for other specified antenatal screening: Secondary | ICD-10-CM

## 2015-12-15 DIAGNOSIS — O09523 Supervision of elderly multigravida, third trimester: Secondary | ICD-10-CM

## 2015-12-15 DIAGNOSIS — B1910 Unspecified viral hepatitis B without hepatic coma: Secondary | ICD-10-CM

## 2015-12-24 ENCOUNTER — Ambulatory Visit (INDEPENDENT_AMBULATORY_CARE_PROVIDER_SITE_OTHER): Payer: Medicaid Other | Admitting: Obstetrics and Gynecology

## 2015-12-24 VITALS — BP 114/69 | HR 84 | Wt 175.0 lb

## 2015-12-24 DIAGNOSIS — Z23 Encounter for immunization: Secondary | ICD-10-CM

## 2015-12-24 DIAGNOSIS — O0930 Supervision of pregnancy with insufficient antenatal care, unspecified trimester: Secondary | ICD-10-CM

## 2015-12-24 DIAGNOSIS — Z98891 History of uterine scar from previous surgery: Secondary | ICD-10-CM | POA: Diagnosis not present

## 2015-12-24 DIAGNOSIS — Z3493 Encounter for supervision of normal pregnancy, unspecified, third trimester: Secondary | ICD-10-CM

## 2015-12-24 LAB — POCT URINALYSIS DIP (DEVICE)
Bilirubin Urine: NEGATIVE
Glucose, UA: NEGATIVE mg/dL
HGB URINE DIPSTICK: NEGATIVE
Ketones, ur: NEGATIVE mg/dL
Leukocytes, UA: NEGATIVE
NITRITE: NEGATIVE
PH: 6 (ref 5.0–8.0)
Protein, ur: NEGATIVE mg/dL
Specific Gravity, Urine: 1.01 (ref 1.005–1.030)
UROBILINOGEN UA: 0.2 mg/dL (ref 0.0–1.0)

## 2015-12-24 NOTE — Progress Notes (Signed)
Subjective:  Mariah Henson is a 35 y.o. [id number] at [redacted]w[redacted]d being seen today for ongoing prenatal care.  She is currently monitored for the following issues for this low-risk pregnancy and has Supervision of low-risk pregnancy; History of C-section; Late prenatal care affecting pregnancy; Sickle cell trait (HCC); and Red blood cell antibody positive on her problem list. Patient had 2 successful VBAC's and plans for VBAC with this pregnancy.   Patient reports no complaints.  Contractions: Not present. Vag. Bleeding: None.  Movement: Present. Denies leaking of fluid.   The following portions of the patient's history were reviewed and updated as appropriate: allergies, current medications, past family history, past medical history, past social history, past surgical history and problem list. Problem list updated.  Objective:   Vitals:   12/24/15 0804  BP: 114/69  Pulse: 84  Weight: 175 lb (79.4 kg)    Fetal Status: Fetal Heart Rate (bpm): 148   Movement: Present     General:  Alert, oriented and cooperative. Patient is in no acute distress.  Skin: Skin is warm and dry. No rash noted.   Cardiovascular: Normal heart rate noted  Respiratory: Normal respiratory effort, no problems with respiration noted  Abdomen: Soft, gravid, appropriate for gestational age. Pain/Pressure: Present     Pelvic:  Cervical exam deferred        Extremities: Normal range of motion.  Edema: None  Mental Status: Normal mood and affect. Normal behavior. Normal judgment and thought content.   Urinalysis: Urine Protein: Negative Urine Glucose: Negative  Assessment and Plan:  Pregnancy: [id number] at [redacted]w[redacted]d  1. Supervision of low-risk pregnancy, third trimester  - GTT 1 hour normal- reviewed with patient   2. History of C-section  - Plans for TOLAC, form signed   3. Late prenatal care affecting pregnancy  - [place] 34-36 weeks for growth; scheduled  - TDAP today     Preterm labor symptoms and general obstetric  precautions including but not limited to vaginal bleeding, contractions, leaking of fluid and fetal movement were reviewed in detail with the patient. Please refer to After Visit Summary for other counseling recommendations.  Return in about 2 weeks (around 01/07/2016).   Duane Lope, NP

## 2016-01-07 ENCOUNTER — Encounter: Payer: Medicaid Other | Admitting: Family

## 2016-01-08 ENCOUNTER — Ambulatory Visit (INDEPENDENT_AMBULATORY_CARE_PROVIDER_SITE_OTHER): Payer: Medicaid Other | Admitting: Family

## 2016-01-08 ENCOUNTER — Other Ambulatory Visit: Payer: Self-pay | Admitting: Obstetrics and Gynecology

## 2016-01-08 ENCOUNTER — Ambulatory Visit (HOSPITAL_COMMUNITY)
Admission: RE | Admit: 2016-01-08 | Discharge: 2016-01-08 | Disposition: A | Discharge status: Home / Self Care | Payer: Medicaid Other | Source: Ambulatory Visit | Attending: Obstetrics and Gynecology | Admitting: Obstetrics and Gynecology

## 2016-01-08 ENCOUNTER — Encounter: Payer: Self-pay | Admitting: *Deleted

## 2016-01-08 VITALS — BP 114/71 | HR 69 | Wt 172.2 lb

## 2016-01-08 DIAGNOSIS — O09523 Supervision of elderly multigravida, third trimester: Secondary | ICD-10-CM

## 2016-01-08 DIAGNOSIS — Z3493 Encounter for supervision of normal pregnancy, unspecified, third trimester: Secondary | ICD-10-CM

## 2016-01-08 DIAGNOSIS — IMO0002 Reserved for concepts with insufficient information to code with codable children: Secondary | ICD-10-CM

## 2016-01-08 DIAGNOSIS — Z36 Encounter for antenatal screening of mother: Secondary | ICD-10-CM | POA: Diagnosis not present

## 2016-01-08 DIAGNOSIS — Z0489 Encounter for examination and observation for other specified reasons: Secondary | ICD-10-CM

## 2016-01-08 DIAGNOSIS — Z3A35 35 weeks gestation of pregnancy: Secondary | ICD-10-CM

## 2016-01-08 DIAGNOSIS — O0933 Supervision of pregnancy with insufficient antenatal care, third trimester: Secondary | ICD-10-CM | POA: Diagnosis not present

## 2016-01-08 DIAGNOSIS — D573 Sickle-cell trait: Secondary | ICD-10-CM | POA: Diagnosis not present

## 2016-01-08 DIAGNOSIS — O0930 Supervision of pregnancy with insufficient antenatal care, unspecified trimester: Secondary | ICD-10-CM

## 2016-01-08 DIAGNOSIS — O99013 Anemia complicating pregnancy, third trimester: Secondary | ICD-10-CM

## 2016-01-08 DIAGNOSIS — Z98891 History of uterine scar from previous surgery: Secondary | ICD-10-CM

## 2016-01-08 LAB — POCT URINALYSIS DIP (DEVICE)
BILIRUBIN URINE: NEGATIVE
Glucose, UA: NEGATIVE mg/dL
Hgb urine dipstick: NEGATIVE
KETONES UR: NEGATIVE mg/dL
LEUKOCYTES UA: NEGATIVE
NITRITE: NEGATIVE
PH: 6 (ref 5.0–8.0)
Protein, ur: NEGATIVE mg/dL
Specific Gravity, Urine: 1.015 (ref 1.005–1.030)
Urobilinogen, UA: 0.2 mg/dL (ref 0.0–1.0)

## 2016-01-08 NOTE — Progress Notes (Signed)
Subjective:  Mariah Henson is a 35 y.o. [id number] at 7242w2d being seen today for ongoing prenatal care.  She is currently monitored for the following issues for this high-risk pregnancy and has Supervision of low-risk pregnancy; History of C-section; Late prenatal care affecting pregnancy; Sickle cell trait (HCC); and Red blood cell antibody positive on her problem list.  Patient reports no complaints.  Contractions: Not present.  .  Movement: Present. Denies leaking of fluid.   The following portions of the patient's history were reviewed and updated as appropriate: allergies, current medications, past family history, past medical history, past social history, past surgical history and problem list. Problem list updated.  Objective:   Vitals:   01/08/16 0932  BP: 114/71  Pulse: 69  Weight: 172 lb 3.2 oz (78.1 kg)    Fetal Status: Fetal Heart Rate (bpm): 124 Fundal Height: 36 cm Movement: Present     General:  Alert, oriented and cooperative. Patient is in no acute distress.  Skin: Skin is warm and dry. No rash noted.   Cardiovascular: Normal heart rate noted  Respiratory: Normal respiratory effort, no problems with respiration noted  Abdomen: Soft, gravid, appropriate for gestational age. Pain/Pressure: Present     Pelvic:  Cervical exam deferred        Extremities: Normal range of motion.  Edema: None  Mental Status: Normal mood and affect. Normal behavior. Normal judgment and thought content.   Urinalysis: Urine Protein: Negative Urine Glucose: Negative  Assessment and Plan:  Pregnancy: G6P5005 at 5842w2d  1. Sickle cell trait (HCC) - Pt informed today (not aware) - FOB unable to be tested, not with patient (different father than other children)  2. . History of C-section - Desires TOLAC (2 prior VBAC) - Consent reviewed via interpreter Alona BeneJoyce with Language [employer]  Preterm labor symptoms and general obstetric precautions including but not limited to vaginal bleeding,  contractions, leaking of fluid and fetal movement were reviewed in detail with the patient. Please refer to After Visit Summary for other counseling recommendations.  Return in about 1 week (around 01/15/2016).   Eino FarberWalidah Kennith GainN Karim, CNM

## 2016-01-15 ENCOUNTER — Other Ambulatory Visit (HOSPITAL_COMMUNITY)
Admission: RE | Admit: 2016-01-15 | Discharge: 2016-01-15 | Disposition: A | Discharge status: Home / Self Care | Payer: BLUE CROSS/BLUE SHIELD | Source: Ambulatory Visit | Attending: Obstetrics & Gynecology | Admitting: Obstetrics & Gynecology

## 2016-01-15 ENCOUNTER — Ambulatory Visit (INDEPENDENT_AMBULATORY_CARE_PROVIDER_SITE_OTHER): Payer: Medicaid Other | Admitting: Obstetrics & Gynecology

## 2016-01-15 VITALS — BP 107/75 | HR 65 | Wt 173.2 lb

## 2016-01-15 DIAGNOSIS — Z113 Encounter for screening for infections with a predominantly sexual mode of transmission: Secondary | ICD-10-CM | POA: Insufficient documentation

## 2016-01-15 DIAGNOSIS — O0930 Supervision of pregnancy with insufficient antenatal care, unspecified trimester: Secondary | ICD-10-CM

## 2016-01-15 DIAGNOSIS — Z3493 Encounter for supervision of normal pregnancy, unspecified, third trimester: Secondary | ICD-10-CM

## 2016-01-15 DIAGNOSIS — O0933 Supervision of pregnancy with insufficient antenatal care, third trimester: Secondary | ICD-10-CM | POA: Diagnosis present

## 2016-01-15 LAB — POCT URINALYSIS DIP (DEVICE)
Bilirubin Urine: NEGATIVE
GLUCOSE, UA: NEGATIVE mg/dL
Hgb urine dipstick: NEGATIVE
KETONES UR: NEGATIVE mg/dL
Leukocytes, UA: NEGATIVE
NITRITE: NEGATIVE
PROTEIN: NEGATIVE mg/dL
Specific Gravity, Urine: <=1.005 (ref 1.005–1.030)
UROBILINOGEN UA: 0.2 mg/dL (ref 0.0–1.0)
pH: 5.5 (ref 5.0–8.0)

## 2016-01-15 LAB — OB RESULTS CONSOLE GBS: STREP GROUP B AG: NEGATIVE

## 2016-01-15 NOTE — Progress Notes (Signed)
Subjective:  Mariah Henson is a 35 y.o. [id number] at 4424w2d being seen today for ongoing prenatal care.  She is currently monitored for the following issues for this high-risk pregnancy and has Supervision of low-risk pregnancy; History of C-section; Late prenatal care affecting pregnancy; Sickle cell trait (HCC); and Red blood cell antibody positive on her problem list.  Patient reports occasional contractions.  Contractions: Irritability. Vag. Bleeding: None.  Movement: Present. Denies leaking of fluid.   The following portions of the patient's history were reviewed and updated as appropriate: allergies, current medications, past family history, past medical history, past social history, past surgical history and problem list. Problem list updated.  Objective:   Vitals:   01/15/16 0834  BP: 107/75  Pulse: 65  Weight: 173 lb 3.2 oz (78.6 kg)    Fetal Status: Fetal Heart Rate (bpm): 130   Movement: Present     General:  Alert, oriented and cooperative. Patient is in no acute distress.  Skin: Skin is warm and dry. No rash noted.   Cardiovascular: Normal heart rate noted  Respiratory: Normal respiratory effort, no problems with respiration noted  Abdomen: Soft, gravid, appropriate for gestational age. Pain/Pressure: Absent     Pelvic:  Cervical exam performed        Extremities: Normal range of motion.  Edema: Trace  Mental Status: Normal mood and affect. Normal behavior. Normal judgment and thought content.   Urinalysis: Urine Protein: Negative Urine Glucose: Negative  Assessment and Plan:  Pregnancy: G6P5005 at 2624w2d  1. Supervision of low-risk pregnancy, third trimester Signed TOLAC - Culture, beta strep (group b only) - GC[facility]Chlamydia probe amp [facility]  Preterm labor symptoms and general obstetric precautions including but not limited to vaginal bleeding, contractions, leaking of fluid and fetal movement were reviewed in detail with the patient. Please refer to  After Visit Summary for other counseling recommendations.  Return in 1 week (on 01/22/2016).   Adam PhenixJames G Kalysta Kneisley, MD

## 2016-01-15 NOTE — Patient Instructions (Signed)
Trial of Labor After Cesarean Delivery Information A trial of labor after cesarean delivery (TOLAC) is when a woman tries to give birth vaginally after a previous cesarean delivery. TOLAC may be a safe and appropriate option for you depending on your medical history and other risk factors. When TOLAC is successful and you are able to have a vaginal delivery, this is called a vaginal birth after cesarean delivery (VBAC).  CANDIDATES FOR TOLAC TOLAC is possible for some women who:  Have undergone one or two prior cesarean deliveries in which the incision of the uterus was horizontal (low transverse).  Are carrying twins and have had one prior low transverse incision during a cesarean delivery.  Do not have a vertical (classical) uterine scar.  Have not had a tear in the wall of their uterus (uterine rupture). TOLAC is also supported for women who meet appropriate criteria and:  Are under the age of 40 years.  Are tall and have a body mass index (BMI) of less than 30.  Have an unknown uterine scar.  Give birth in a facility equipped to handle an emergency cesarean delivery. This team should be able to handle possible complications such as a uterine rupture.  Have thorough counseling about the benefits and risks of TOLAC.  Have discussed future pregnancy plans with their health care provider.  Plan to have several more pregnancies. MOST SUCCESSFUL CANDIDATES FOR TOLAC:  Have had a successful vaginal delivery before or after their cesarean delivery.  Experience labor that begins naturally on or before the due date (40 weeks of gestation).  Do not have a very large (macrosomic) baby.   Had a prior cesarean delivery but are not currently experiencing factors that would prompt a cesarean delivery (such as a breech position).  Had only one prior cesarean delivery.  Had a prior cesarean delivery that was performed early in labor and not after full cervical dilation. TOLAC may be most  appropriate for women who meet the above guidelines and who plan to have more pregnancies. TOLAC is not recommended for home births. LEAST SUCCESSFUL CANDIDATES FOR TOLAC:  Have an induced labor with an unfavorable cervix. An unfavorable cervix is when the cervix is not dilating enough (among other factors).  Have never had a vaginal delivery.  Have had more than two cesarean deliveries.  Have a pregnancy at more than 40 weeks of gestation.  Are pregnant with a baby with a suspected weight greater than 4,000 grams (8 pounds) and who have no prior history of a vaginal delivery.  Have closely spaced pregnancies. SUGGESTED BENEFITS OF TOLAC  You may have a faster recovery time.  You may have a shorter stay in the hospital.  You may have less pain and fewer problems than with a cesarean delivery. Women who have a cesarean delivery have a higher chance of needing blood or getting a fever, an infection, or a blood clot in the legs. SUGGESTED RISKS OF TOLAC The highest risk of complications happens to women who attempt a TOLAC and fail. A failed TOLAC results in an unplanned cesarean delivery. Risks related to TOLAC or repeat cesarean deliveries include:   Blood loss.  Infection.  Blood clot.  Injury to surrounding tissues or organs.  Having to remove the uterus (hysterectomy).  Potential problems with the placenta (such as placenta previa or placenta accreta) in future pregnancies. Although very rare, the main concerns with TOLAC are:  Rupture of the uterine scar from a past cesarean delivery.  Needing an   emergency cesarean delivery.  Having a bad outcome for the baby (perinatal morbidity). FOR MORE INFORMATION American Congress of Obstetricians and Gynecologists: www.acog.org American College of Nurse-Midwives: www.midwife.org   This information is not intended to replace advice given to you by your health care provider. Make sure you discuss any questions you have with  your health care provider.   Document Released: 01/26/2011 Document Revised: 02/28/2013 Document Reviewed: 10/30/2012 Elsevier Interactive Patient Education 2016 Elsevier Inc.  

## 2016-01-15 NOTE — Progress Notes (Signed)
36 week cultures today 

## 2016-01-16 LAB — GC/CHLAMYDIA PROBE AMP (~~LOC~~) NOT AT ARMC
CHLAMYDIA, DNA PROBE: NEGATIVE
Neisseria Gonorrhea: NEGATIVE

## 2016-01-17 LAB — CULTURE, BETA STREP (GROUP B ONLY)

## 2016-01-29 ENCOUNTER — Ambulatory Visit (INDEPENDENT_AMBULATORY_CARE_PROVIDER_SITE_OTHER): Payer: Medicaid Other | Admitting: Advanced Practice Midwife

## 2016-01-29 ENCOUNTER — Inpatient Hospital Stay (HOSPITAL_COMMUNITY)
Admission: AD | Admit: 2016-01-29 | Discharge: 2016-01-31 | DRG: 775 | Disposition: A | Discharge status: Home / Self Care | Payer: Medicaid Other | Source: Ambulatory Visit | Attending: Obstetrics and Gynecology | Admitting: Obstetrics and Gynecology

## 2016-01-29 ENCOUNTER — Encounter (HOSPITAL_COMMUNITY): Payer: Self-pay | Admitting: *Deleted

## 2016-01-29 DIAGNOSIS — R768 Other specified abnormal immunological findings in serum: Secondary | ICD-10-CM

## 2016-01-29 DIAGNOSIS — IMO0001 Reserved for inherently not codable concepts without codable children: Secondary | ICD-10-CM

## 2016-01-29 DIAGNOSIS — D573 Sickle-cell trait: Secondary | ICD-10-CM | POA: Diagnosis present

## 2016-01-29 DIAGNOSIS — Z3493 Encounter for supervision of normal pregnancy, unspecified, third trimester: Secondary | ICD-10-CM

## 2016-01-29 DIAGNOSIS — O9902 Anemia complicating childbirth: Secondary | ICD-10-CM | POA: Diagnosis present

## 2016-01-29 DIAGNOSIS — O34219 Maternal care for unspecified type scar from previous cesarean delivery: Secondary | ICD-10-CM

## 2016-01-29 DIAGNOSIS — O34211 Maternal care for low transverse scar from previous cesarean delivery: Secondary | ICD-10-CM | POA: Diagnosis present

## 2016-01-29 DIAGNOSIS — Z98891 History of uterine scar from previous surgery: Secondary | ICD-10-CM

## 2016-01-29 DIAGNOSIS — O0930 Supervision of pregnancy with insufficient antenatal care, unspecified trimester: Secondary | ICD-10-CM

## 2016-01-29 DIAGNOSIS — Z3A38 38 weeks gestation of pregnancy: Secondary | ICD-10-CM

## 2016-01-29 DIAGNOSIS — Z3483 Encounter for supervision of other normal pregnancy, third trimester: Secondary | ICD-10-CM | POA: Diagnosis present

## 2016-01-29 LAB — POCT URINALYSIS DIP (DEVICE)
Bilirubin Urine: NEGATIVE
GLUCOSE, UA: NEGATIVE mg/dL
Ketones, ur: NEGATIVE mg/dL
LEUKOCYTES UA: NEGATIVE
NITRITE: NEGATIVE
PROTEIN: NEGATIVE mg/dL
Specific Gravity, Urine: 1.015 (ref 1.005–1.030)
UROBILINOGEN UA: 0.2 mg/dL (ref 0.0–1.0)
pH: 6 (ref 5.0–8.0)

## 2016-01-29 LAB — CBC
HCT: 34.8 % — ABNORMAL LOW (ref 36.0–46.0)
HEMOGLOBIN: 12.5 g/dL (ref 12.0–15.0)
MCH: 28.6 pg (ref 26.0–34.0)
MCHC: 35.9 g/dL (ref 30.0–36.0)
MCV: 79.6 fL (ref 78.0–100.0)
PLATELETS: 226 10*3/uL (ref 150–400)
RBC: 4.37 MIL/uL (ref 3.87–5.11)
RDW: 14 % (ref 11.5–15.5)
WBC: 7.5 10*3/uL (ref 4.0–10.5)

## 2016-01-29 LAB — RPR: RPR Ser Ql: NONREACTIVE

## 2016-01-29 MED ORDER — WITCH HAZEL-GLYCERIN EX PADS: 1.0000 "application " | MEDICATED_PAD | CUTANEOUS | Status: DC | PRN

## 2016-01-29 MED ORDER — SOD CITRATE-CITRIC ACID 500-334 MG/5ML PO SOLN: 30.0000 mL | ORAL | Status: DC | PRN

## 2016-01-29 MED ORDER — SENNOSIDES-DOCUSATE SODIUM 8.6-50 MG PO TABS
2.0000 | ORAL_TABLET | ORAL | Status: DC
  Administered 2016-01-29 – 2016-01-30 (×2): 2 via ORAL
  Filled 2016-01-29 (×2): qty 2

## 2016-01-29 MED ORDER — OXYCODONE-ACETAMINOPHEN 5-325 MG PO TABS: 2.0000 | ORAL_TABLET | ORAL | Status: DC | PRN

## 2016-01-29 MED ORDER — PRENATAL MULTIVITAMIN CH
1.0000 | ORAL_TABLET | Freq: Every day | ORAL | Status: DC
  Administered 2016-01-30: 1 via ORAL
  Filled 2016-01-29: qty 1

## 2016-01-29 MED ORDER — ACETAMINOPHEN 325 MG PO TABS
650.0000 mg | ORAL_TABLET | ORAL | Status: DC | PRN
  Administered 2016-01-29 – 2016-01-31 (×6): 650 mg via ORAL
  Filled 2016-01-29 (×6): qty 2

## 2016-01-29 MED ORDER — DIBUCAINE 1 % RE OINT: 1.0000 "application " | TOPICAL_OINTMENT | RECTAL | Status: DC | PRN

## 2016-01-29 MED ORDER — BENZOCAINE-MENTHOL 20-0.5 % EX AERO: 1.0000 "application " | INHALATION_SPRAY | CUTANEOUS | Status: DC | PRN

## 2016-01-29 MED ORDER — COCONUT OIL OIL: 1.0000 "application " | TOPICAL_OIL | Status: DC | PRN

## 2016-01-29 MED ORDER — MISOPROSTOL 200 MCG PO TABS
800.0000 ug | ORAL_TABLET | Freq: Once | ORAL | Status: AC
  Administered 2016-01-29: 800 ug via ORAL
  Filled 2016-01-29: qty 4

## 2016-01-29 MED ORDER — ONDANSETRON HCL 4 MG PO TABS: 4.0000 mg | ORAL_TABLET | ORAL | Status: DC | PRN

## 2016-01-29 MED ORDER — LACTATED RINGERS IV SOLN: 500.0000 mL | INTRAVENOUS | Status: DC | PRN

## 2016-01-29 MED ORDER — SIMETHICONE 80 MG PO CHEW: 80.0000 mg | CHEWABLE_TABLET | ORAL | Status: DC | PRN

## 2016-01-29 MED ORDER — ACETAMINOPHEN 325 MG PO TABS
650.0000 mg | ORAL_TABLET | ORAL | Status: DC | PRN
  Administered 2016-01-29: 650 mg via ORAL
  Filled 2016-01-29: qty 2

## 2016-01-29 MED ORDER — OXYCODONE-ACETAMINOPHEN 5-325 MG PO TABS
1.0000 | ORAL_TABLET | ORAL | Status: DC | PRN
Start: 2016-01-29 — End: 2016-01-29

## 2016-01-29 MED ORDER — OXYTOCIN BOLUS FROM INFUSION
500.0000 mL | Freq: Once | INTRAVENOUS | Status: AC
  Administered 2016-01-29: 500 mL via INTRAVENOUS

## 2016-01-29 MED ORDER — ZOLPIDEM TARTRATE 5 MG PO TABS: 5.0000 mg | ORAL_TABLET | Freq: Every evening | ORAL | Status: DC | PRN

## 2016-01-29 MED ORDER — LACTATED RINGERS IV SOLN
INTRAVENOUS | Status: DC
  Administered 2016-01-29: 09:00:00 via INTRAVENOUS

## 2016-01-29 MED ORDER — LIDOCAINE HCL (PF) 1 % IJ SOLN
30.0000 mL | INTRAMUSCULAR | Status: DC | PRN
  Filled 2016-01-29: qty 30

## 2016-01-29 MED ORDER — IBUPROFEN 600 MG PO TABS: 600.0000 mg | ORAL_TABLET | Freq: Four times a day (QID) | ORAL | Status: DC

## 2016-01-29 MED ORDER — TETANUS-DIPHTH-ACELL PERTUSSIS 5-2.5-18.5 LF-MCG/0.5 IM SUSP: 0.5000 mL | Freq: Once | INTRAMUSCULAR | Status: DC

## 2016-01-29 MED ORDER — DIPHENHYDRAMINE HCL 25 MG PO CAPS: 25.0000 mg | ORAL_CAPSULE | Freq: Four times a day (QID) | ORAL | Status: DC | PRN

## 2016-01-29 MED ORDER — ONDANSETRON HCL 4 MG/2ML IJ SOLN: 4.0000 mg | Freq: Four times a day (QID) | INTRAMUSCULAR | Status: DC | PRN

## 2016-01-29 MED ORDER — ONDANSETRON HCL 4 MG/2ML IJ SOLN: 4.0000 mg | INTRAMUSCULAR | Status: DC | PRN

## 2016-01-29 MED ORDER — OXYTOCIN 40 UNITS IN LACTATED RINGERS INFUSION - SIMPLE MED
2.5000 [IU]/h | INTRAVENOUS | Status: DC
  Administered 2016-01-29: 2.5 [IU]/h via INTRAVENOUS
  Filled 2016-01-29: qty 1000

## 2016-01-29 MED ORDER — FLEET ENEMA 7-19 GM/118ML RE ENEM: 1.0000 | ENEMA | RECTAL | Status: DC | PRN

## 2016-01-29 MED ORDER — FENTANYL CITRATE (PF) 100 MCG/2ML IJ SOLN: 100.0000 ug | INTRAMUSCULAR | Status: DC | PRN

## 2016-01-29 NOTE — H&P (Signed)
Mariah Henson is a 35 y.o. female [id number] @ 38.2wks presenting for ctx since yesterday. Had ? Fluid leak. Her preg has been followed by the [facility] and has been remarkable for 1) C/S followed by VBAC x 2- desires VBAC this preg 2) [place] trait  OB History    Gravida Para Term Preterm AB Living   6 5 5  0 0 5   SAB TAB Ectopic Multiple Live Births   0 0 0 0 5     Past Medical History:  Diagnosis Date  . Hepatitis B    Past Surgical History:  Procedure Laterality Date  . CESAREAN SECTION     Family History: family history is not on file. Social History:  reports that she has never smoked. She has never used smokeless tobacco. She reports that she does not drink alcohol. Her drug history is not on file.     Maternal Diabetes: No Genetic Screening: Declined- too late Maternal Ultrasounds/Referrals: Normal Fetal Ultrasounds or other Referrals:  None Maternal Substance Abuse:  No Significant Maternal Medications:  None Significant Maternal Lab Results:  Lab values include: Group B Strep negative Other Comments:  None ROS History  VSS, afeb Last menstrual period 05/06/2015. Exam Physical Exam  Constitutional: She is oriented to person, place, and time. She appears well-developed.  HENT:  Head: Normocephalic.  Neck: Normal range of motion.  Cardiovascular: Normal rate.   Respiratory: Effort normal.  GI:  EFM 120s, +accels, no decels Ctx q 3 mins, spont  Musculoskeletal: Normal range of motion.  Neurological: She is alert and oriented to person, place, and time.  Skin: Skin is warm and dry.  Psychiatric: She has a normal mood and affect. Her behavior is normal. Thought content normal.    Prenatal labs: ABO, Rh: O/POS/-- (07/17 1249) Antibody: POS (07/17 1249) Rubella: 12.40, < 20.00 (07/17 1249) RPR: NON REAC (07/17 1249)  HBsAg: NEGATIVE (07/17 1249)  HIV: NONREACTIVE (07/17 1249)  GBS:     Assessment/Plan: IUP@term  TOLAC Active labor/transition  [facility] Expectant management Anticipate SVD   Cam HaiSHAW, Ninfa Giannelli CNM 01/29/2016, 8:47 AM

## 2016-01-29 NOTE — Lactation Note (Signed)
This note was copied from a baby's chart. Lactation Consultation Note  Patient Name: Mariah Henson NWGNF'AToday's Date: 01/29/2016 Reason for consult: Initial assessment   Initial consult with Exp BF mom of 1 hour old infant. Spoke with mom with assistance of [employer] Interpreter Christina # [phone]. Infant was being weighed and measured, he has fed for 70 minutes after birth. Mom has BF her first 5 children for 2.5 years each. She denies questions about BF at this time.  She did ask about working and pumping for this child, which is new for her. [facility] and pumping while away from baby. Mom was concerned that milk would not be good when pumping at work. Discussed using cooler or that EBM good for 6-8 hours at room temperature. Mom pleased to hear this.   Mom took infant back and latched him to the right breast in side lying position. He latched well and was nursing when I left the room. Nipple care of EBM discussed. Mom with compressible breasts and everted nipples.   BF Resources and [facility] Broch[facility] given, mom informed of IP/OP Services, BF Support Groups and LC phone #. Enc mom to call with questions/concerns prn.      Maternal Data Formula Feeding for Exclusion: No Has patient been taught Hand Expression?: Yes Does the patient have breastfeeding experience prior to this delivery?: Yes  Feeding Feeding Type: Breast Fed Length of feed: 10 min  LATCH Score/Interventions Latch: Grasps breast easily, tongue down, lips flanged, rhythmical sucking. Intervention(s): Adjust position;Assist with latch  Audible Swallowing: A few with stimulation Intervention(s): Skin to skin  Type of Nipple: Everted at rest and after stimulation  Comfort (Breast/Nipple): Soft / non-tender     Hold (Positioning): No assistance needed to correctly position infant at breast. Intervention(s): Breastfeeding basics reviewed;Support Pillows;Position options;Skin to skin  LATCH Score:  9  Lactation Tools Discussed/Used WIC Program: Yes   Consult Status Consult Status: Follow-up Date: 01/30/16 Follow-up type: In-patient    Mariah FloodSharon S Hice 01/29/2016, 11:08 AM

## 2016-01-30 LAB — TYPE AND SCREEN
ABO/RH(D): O POS
Antibody Screen: POSITIVE
DAT, IgG: NEGATIVE
UNIT DIVISION: 0
Unit division: 0

## 2016-01-30 NOTE — Clinical Social Work Maternal (Addendum)
  CLINICAL SOCIAL WORK MATERNAL/CHILD NOTE  Patient Details  Name: Mariah Henson MRN: [id number] Date of Birth: Oct 25, 1980  Date:  01/30/2016  Clinical Social Worker Initiating Note:  Ferdinand Lango Dean Wonder, MSW, LCSW-A Date/ Time Initiated:  01/30/16/1108     Child's Name:  Mariah Henson    Legal Guardian:  Mother   Need for Interpreter:  Swahili   Date of Referral:  01/29/16     Reason for Referral:  Other (Comment) (MOB has a language and transportation barrier )   Referral Source:  Physician   Address:  [place], [place] [address]   Phone number:  [phone]   Household Members:  Self, Minor Children   Natural Supports (not living in the home):  Children, Armed forces training and education officer Supports: Case Metallurgist   Employment: Part-time   Type of Work:     Education:  9 to 11 years   Pensions consultant:  Medicaid   Other Resources:  Theatre stage manager Considerations Which May Impact Care:  Per Presenter, broadcasting none   Strengths:  Compliance with medical plan , Pediatrician chosen    Risk Factors/Current Problems:  Transportation , Basic Needs  (MOB in need of crib or [place]ett for baby to sleep in )   Cognitive State:  Alert , Goal Oriented , Able to Concentrate , Insightful    Mood/Affect:  Calm , Comfortable , Relaxed    CSW Assessment: CSW met with MOB at bedside. This Probation officer explained role and reasoning for visit being because she has a language and transportation barrier. At this time MOB expressed to CSW that she only has support from her neighbor in which  It has become difficult because her neighbor works and is not always available to offer transportation to and from [place] apts. This Probation officer provided MOB with resources for [employer], [employer], Medicaid transportation, [place] and center for new [employer]. This Probation officer has also requested a bundle package for family. MOB requested assistance obtaining a  car seat and has money to contribute towards the purchase of it. This Probation officer notified Ginger with volunteer services and left a VM regarding the matter. No further needs addressed or requested at this time. Case closed to this CSW.   CSW Plan/Description:  Information/Referral to [employer], LCSW 01/30/2016, 11:11 AM

## 2016-01-30 NOTE — Lactation Note (Signed)
This note was copied from a baby's chart. Lactation Consultation Note  Patient Name: Mariah Henson ZOXWR'UToday's Date: 01/30/2016 Reason for consult: Follow-up assessment IPad interpreter used. Baby at 33 hr of life. Mom reports baby is latching well. She has milk bilateral nipple soreness. Had her sit back and bring baby to her. She stated this position felt better. Discussed baby behavior, feeding frequency, baby belly size, voids, wt loss, breast changes, and nipple care. Mom is aware of lactation services and support group. She will call as needed.    Maternal Data    Feeding Feeding Type: Breast Fed Length of feed: 20 min  LATCH Score/Interventions Latch: Grasps breast easily, tongue down, lips flanged, rhythmical sucking. Intervention(s): Adjust position;Breast compression;Breast massage  Audible Swallowing: Spontaneous and intermittent Intervention(s): Skin to skin  Type of Nipple: Everted at rest and after stimulation  Comfort (Breast/Nipple): Soft / non-tender     Hold (Positioning): No assistance needed to correctly position infant at breast. Intervention(s): Position options;Support Pillows  LATCH Score: 10  Lactation Tools Discussed/Used     Consult Status Consult Status: Follow-up Date: 01/31/16 Follow-up type: In-patient    Rulon Eisenmengerlizabeth E Aadarsh Cozort 01/30/2016, 6:12 PM

## 2016-01-30 NOTE — Lactation Note (Signed)
This note was copied from a baby's chart. Lactation Consultation Note  Patient Name: Boy Kerby Norasende Coscia ZOXWR'UToday's Date: 01/30/2016 Reason for consult: Follow-up assessment Baby at 30 hr of life. Dyad was sleeping upon entry.   Maternal Data    Feeding Feeding Type: Breast Fed Length of feed: 10 min  LATCH Score/Interventions                      Lactation Tools Discussed/Used     Consult Status Consult Status: Follow-up Date: 01/30/16 Follow-up type: In-patient    Rulon Eisenmengerlizabeth E Jasmaine Rochel 01/30/2016, 3:43 PM

## 2016-01-30 NOTE — Progress Notes (Signed)
POSTPARTUM PROGRESS NOTE  Post Partum Day 1  Subjective:  Mariah Henson is a 35 y.o. [id number] 3435w2d s/p SVD.  No acute events overnight.  Pt denies problems with ambulating, voiding or po intake.  She denies nausea or vomiting.  Pain is well controlled.  Lochia Small.   Objective: Blood pressure (!) 114/51, pulse (!) 51, temperature 97.9 F (36.6 C), temperature source Oral, resp. rate 18, height 5\' 4"  (1.626 m), weight 175 lb (79.4 kg), last menstrual period 05/06/2015, SpO2 94 %, unknown if currently breastfeeding.  Physical Exam:  General: alert, cooperative and no distress Lochia:normal flow Chest: no respiratory distress Heart:regular rate, distal pulses intact Abdomen: soft, nontender,  Uterine Fundus: firm, appropriately tender DVT Evaluation: No calf swelling or tenderness Extremities: no edema  Recent Labs  01/29/16 0835  HGB 12.5  HCT 34.8*    Assessment/Plan:  Mariah Henson is a 35 y.o. [id number] 3235w2d s/p NSVD. PPD#1. Breastfeeding. Doing well.  - Routine postpartum care - D/c likely tomorrow.    LOS: 1 day   Kandra NicolasJulie P DegeleMD 01/30/2016, 10:46 AM    OB FELLOW POSTPARTUM PROGRESS NOTE ATTESTATION  I have seen and examined this patient and agree with above documentation in the resident's note.   Ernestina PennaNicholas Schenk, MD 1:40 PM

## 2016-01-31 MED ORDER — ACETAMINOPHEN 325 MG PO TABS: 650.0000 mg | ORAL_TABLET | ORAL | 0 refills | Status: AC | PRN

## 2016-01-31 MED ORDER — IBUPROFEN 600 MG PO TABS: 600.0000 mg | ORAL_TABLET | Freq: Four times a day (QID) | ORAL | 0 refills | Status: DC

## 2016-01-31 NOTE — Progress Notes (Signed)
CSW met with pt re: need for transportation home/car seat.  Pt provided 30.00 for car seat and paperwork completed.  Per pt, she typically uses the bus for transportation, but was unsure if she could manage on the bus today with baby/belongings, so taxi voucher transportation was provided.  Pt plans on using the bus to take baby to his f/u appointment on 02/02/16, as she has limited access to other means of transportation .  Previous CSW completed psychosocial assessment on pt 01/30/16 and no other needs were identified today during CSW visit.  Interpreting services were utilized to communicate with this pt today.  Creta Levin, LCSW Weekend Coverage [phone]

## 2016-01-31 NOTE — Discharge Instructions (Signed)

## 2016-01-31 NOTE — Discharge Summary (Signed)
OB Discharge Summary     Patient Name: Mariah Henson DOB: 06/20/1980 MRN: [id number]  Date of admission: 01/29/2016 Delivering MD: Mariah Henson, JULIE P   Date of discharge: 01/31/2016  Admitting diagnosis: term,labor Intrauterine pregnancy: 5428w2d     Secondary diagnosis:  Active Problems:   Active labor  Additional problems: none     Discharge diagnosis: VBAC                                                                                                Post partum procedures:none  Augmentation: AROM  Complications: None  Hospital course:  Onset of Labor With Vaginal Delivery     35 y.o. yo [id number] at 2228w2d was admitted in Active Labor on 01/29/2016. Patient had an uncomplicated labor course as follows:  Membrane Rupture Time/Date: 8:53 AM ,01/29/2016   Intrapartum Procedures: Episiotomy: None [1]                                         Lacerations:  None [1]  Patient had a delivery of a Viable infant. 01/29/2016  Information for the patient's newborn:  Mariah Henson, Boy Mariah Henson [[id number]][[id number]]  Delivery Method: VBAC, Spontaneous (Filed from Delivery Summary)    Pateint had an uncomplicated postpartum course.  She is ambulating, tolerating a regular diet, passing flatus, and urinating well. Patient is discharged home in stable condition on 01/31/16.    Physical exam Vitals:   01/29/16 2118 01/30/16 0606 01/30/16 1827 01/31/16 0638  BP: (!) 101/55 (!) 114/51 110/68 (!) 94/52  Pulse: (!) 57 (!) 51 (!) 53 62  Resp: 18 18 18 18   Temp: 98.9 F (37.2 C) 97.9 F (36.6 C)  98 F (36.7 C)  TempSrc:  Oral  Oral  SpO2:      Weight:      Height:       General: alert, cooperative and no distress Lochia: appropriate Uterine Fundus: firm Incision: NA DVT Evaluation: No evidence of DVT seen on physical exam. No significant calf/ankle edema. Labs: Lab Results  Component Value Date   WBC 7.5 01/29/2016   HGB 12.5 01/29/2016   HCT 34.8 (L) 01/29/2016   MCV 79.6 01/29/2016   PLT 226  01/29/2016   CMP Latest Ref Rng & Units 06/14/2015  Glucose 65 - 99 mg/dL 130(Q101(H)  BUN 6 - 20 mg/dL 11  Creatinine 6.570.44 - 8.461.00 mg/dL 9.620.84  Sodium 952135 - 841145 mmol/L 137  Potassium 3.5 - 5.1 mmol/L 4.4  Chloride 101 - 111 mmol/L 102  CO2 22 - 32 mmol/L 24  Calcium 8.9 - 10.3 mg/dL 9.0  Total Protein 6.5 - 8.1 g/dL 6.9  Total Bilirubin 0.3 - 1.2 mg/dL 0.5  Alkaline Phos 38 - 126 U/L 82  AST 15 - 41 U/L 16  ALT 14 - 54 U/L 11(L)    Discharge instruction: per After Visit Summary and "Baby and Me Booklet".  After visit meds:    Medication List    TAKE these medications   acetaminophen 325  MG tablet Commonly known as:  TYLENOL Take 2 tablets (650 mg total) by mouth every 4 (four) hours as needed (for pain scale < 4).   ibuprofen 600 MG tablet Commonly known as:  ADVIL,MOTRIN Take 1 tablet (600 mg total) by mouth every 6 (six) hours.       Diet: routine diet  Activity: Advance as tolerated. Pelvic rest for 6 weeks.   Outpatient follow up:6 weeks Follow up Appt:Future Appointments Date Time Provider Department Center  03/10/2016 1:40 PM Aviva Signs, CNM [facility]   Follow up Visit:No Follow-up on file.  Postpartum contraception: Depo Provera  Newborn Data: Live born female  Birth Weight: 7 lb 4.1 oz (3290 g) APGAR: 9, 9  Baby Feeding: Breast Disposition:home with mother   01/31/2016 Mariah Penna, MD

## 2016-02-05 NOTE — Progress Notes (Signed)
Receptionist saw that pt looked shaky in lobby. Does not speak [place]. Taken to room immediately and found to by 8 cm. Taken by wheel chair by RN to L&D. Report given to Philipp DeputyKim Shaw, CNM.

## 2016-03-10 ENCOUNTER — Ambulatory Visit: Payer: Medicaid Other | Admitting: Advanced Practice Midwife

## 2016-03-18 ENCOUNTER — Ambulatory Visit (HOSPITAL_COMMUNITY)
Admission: EM | Admit: 2016-03-18 | Discharge: 2016-03-18 | Disposition: A | Discharge status: Home / Self Care | Payer: BLUE CROSS/BLUE SHIELD | Attending: Family Medicine | Admitting: Family Medicine

## 2016-03-18 DIAGNOSIS — T148XXA Other injury of unspecified body region, initial encounter: Secondary | ICD-10-CM

## 2016-03-18 DIAGNOSIS — M545 Low back pain, unspecified: Secondary | ICD-10-CM

## 2016-03-18 MED ORDER — IBUPROFEN 600 MG PO TABS: 600.0000 mg | ORAL_TABLET | Freq: Four times a day (QID) | ORAL | 0 refills | Status: AC

## 2016-03-18 MED ORDER — CYCLOBENZAPRINE HCL 10 MG PO TABS: 10.0000 mg | ORAL_TABLET | Freq: Two times a day (BID) | ORAL | 0 refills | Status: AC | PRN

## 2016-03-18 NOTE — ED Provider Notes (Signed)
[facility]    CSN: [id number] Arrival date & time: 03/18/16  1839     History   Chief Complaint Chief Complaint  Patient presents with  . Back Pain    HPI Mariah Henson is a 35 y.o. [id number] at [redacted]w[redacted]d approximately 6 weeks postpartum after VBAC and uncomplicated postpartum course who presents for back pain.   She speaks no [place], only Swahili, and prefers for her [employer] to interpret for the encounter. For the past week she has had constant, aching, midback pain that does not radiate. Involves both sides equally. Nothing taken/tried. Worse with carrying her baby/bending over/side to side. No leg pain, numbness, weakness, urinary or bowel incontinence. No trauma. No fever.   HPI  Past Medical History:  Diagnosis Date  . Hepatitis B     Patient Active Problem List   Diagnosis Date Noted  . Active labor 01/29/2016  . Sickle cell trait (HCC) 12/11/2015  . 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 Supervision of low-risk pregnancy 12/08/2015  . History of C-section 12/08/2015  . Late prenatal care affecting pregnancy 12/08/2015   Past Surgical History:  Procedure Laterality Date  . CESAREAN SECTION  2005   "baby was not coming down"   OB History    Gravida Para Term Preterm AB Living   6 6 6  0 0 6   SAB TAB Ectopic Multiple Live Births   0 0 0 0 6       Home Medications    Prior to Admission medications   Medication Sig Start Date End Date Taking? Authorizing Provider  acetaminophen (TYLENOL) 325 MG tablet Take 2 tablets (650 mg total) by mouth every 4 (four) hours as needed (for pain scale < 4). 01/31/16   Lorne Skeens, MD  cyclobenzaprine (FLEXERIL) 10 MG tablet Take 1 tablet (10 mg total) by mouth 2 (two) times daily as needed for muscle spasms. 03/18/16   Tyrone Nine, MD  ibuprofen (ADVIL,MOTRIN) 600 MG tablet Take 1 tablet (600 mg total) by mouth every 6 (six) hours. 03/18/16   Tyrone Nine, MD    Family History No family  history on file.  Social History Social History  Substance Use Topics  . Smoking status: Never Smoker  . Smokeless tobacco: Never Used  . Alcohol use No     Allergies   Asa [aspirin]   Review of Systems Review of Systems Per HPI  Physical Exam Triage Vital Signs ED Triage Vitals [03/18/16 1902]  Enc Vitals Group     BP 128/85     Pulse Rate 71     Resp 16     Temp 97.6 F (36.4 C)     Temp Source Oral     SpO2 98 %     Weight      Height      Head Circumference      Peak Flow      Pain Score      Pain Loc      Pain Edu?      Excl. in GC?    No data found.   Updated Vital Signs BP 128/85 (BP Location: Left Arm)   Pulse 71   Temp 97.6 F (36.4 C) (Oral)   Resp 16   SpO2 98%   Physical Exam  Constitutional: She is oriented to person, place, and time. She appears well-developed and well-nourished. No distress.  Eyes: EOM are normal. Pupils are equal, round, and reactive to light. No  scleral icterus.  Neck: Neck supple. No JVD present.  Cardiovascular: Normal rate, regular rhythm, normal heart sounds and intact distal pulses.   No murmur heard. Pulmonary/Chest: Effort normal and breath sounds normal. No respiratory distress.  Abdominal: Soft. Bowel sounds are normal. She exhibits no distension. There is no tenderness.  Musculoskeletal: Normal range of motion. She exhibits no edema.  Back:  Normal skin. Spine with normal alignment and no deformity. No tenderness to vertebral process palpation. Paraspinous muscles in thoracic/upper lumber region are tender and spastic.   Range of motion is full at neck and lumbar sacral regions. Straight leg raise is neg Neuro:  Sensation and motor function 5/5 bilateral lower extremities. Patellar and achilles DTR's 2+  Lymphadenopathy:    She has no cervical adenopathy.  Neurological: She is alert and oriented to person, place, and time. She exhibits normal muscle tone.  Skin: Skin is warm and dry.  Vitals reviewed.  UC  Treatments / Results  Labs (all labs ordered are listed, but only abnormal results are displayed) Labs Reviewed - No data to display  EKG  EKG Interpretation None       Radiology No results found.  Procedures Procedures (including critical care time)  Medications Ordered in UC Medications - No data to display   Initial Impression / Assessment and Plan / UC Course  I have reviewed the triage vital signs and the nursing notes.  Pertinent labs & imaging results that were available during my care of the patient were reviewed by me and considered in my medical decision making (see chart for details).   Final Clinical Impressions(s) / UC Diagnoses   Final diagnoses:  Acute bilateral low back pain without sciatica  Muscle strain   35 y.o. female 6 weeks postpartum from uncomplicated VBAC presents with musculoskeletal back pain. No red flags on history of exam. - Reviewed symptomatic treatments including NSAIDs and heating pad, will also trial muscle relaxer. Precautions given for this. She does not drive.  - Rturn precautions advised.   New Prescriptions New Prescriptions   CYCLOBENZAPRINE (FLEXERIL) 10 MG TABLET    Take 1 tablet (10 mg total) by mouth 2 (two) times daily as needed for muscle spasms.     Tyrone Nineyan B Calden Dorsey, MD 03/18/16 [phone]

## 2016-03-18 NOTE — ED Triage Notes (Signed)
Pt was triaged and evaluated by Dr. Jarvis NewcomerGrunz.

## 2016-03-23 ENCOUNTER — Encounter: Payer: Self-pay | Admitting: General Practice

## 2016-04-05 ENCOUNTER — Encounter: Payer: Self-pay | Admitting: *Deleted

## 2016-04-05 ENCOUNTER — Encounter: Payer: Self-pay | Admitting: Family Medicine

## 2016-04-05 ENCOUNTER — Ambulatory Visit (INDEPENDENT_AMBULATORY_CARE_PROVIDER_SITE_OTHER): Payer: BLUE CROSS/BLUE SHIELD | Admitting: Family Medicine

## 2016-04-05 DIAGNOSIS — Z3009 Encounter for other general counseling and advice on contraception: Secondary | ICD-10-CM

## 2016-04-05 NOTE — Progress Notes (Signed)
Mariah Henson missed a scheduled postpartum appointment - Had a normal pregnancy and delivery. Will send letter to notify her she missed appointment and can call to reschedule.

## 2016-04-05 NOTE — Progress Notes (Signed)
Subjective:   Used [employer] interpreter [phone]. Unable to do weight .   Mariah Henson is a 35 y.o. female who presents for a postpartum visit. She is 9 weeks postpartum following a spontaneous vaginal delivery, VBAC. I have fully reviewed the prenatal and intrapartum course. The delivery was at 38.2 gestational weeks. Outcome: spontaneous vaginal delivery, VBAC. Anesthesia: none. Postpartum course has been uncomplicated, just some mild back pain, midback thoracic region. Baby's course has been uncomplicated. Baby is feeding by breast. Bleeding no bleeding and has already had menses, about 2 weeks ago.. Bowel function is normal. Bladder function is normal. Patient is not sexually active. Contraception method is abstinence and possibly LARC when finds a new partner, not currently with anyone.. Postpartum depression screening: negative.  The following portions of the patient's history were reviewed and updated as appropriate: allergies, current medications, past family history, past medical history, past social history, past surgical history and problem list.  Review of Systems Pertinent items are noted in HPI.   Objective:    BP 131/89   Pulse 85   Breastfeeding? Yes   General:  alert, cooperative, appears stated age and no distress   Breasts:  Breast exam was performed during prenatal visit  Lungs: clear to auscultation bilaterally  Heart:  regular rate and rhythm, S1, S2 normal, no murmur, click, rub or gallop  Abdomen: soft, non-tender; bowel sounds normal; no masses,  no organomegaly  Extremities:  No edema  Neuro:  CN II-XII grossly normal, normal gait  MSK: Tender to touch mid-back thoracic region; No CVA tenderness        Assessment:    9 week postpartum exam. Pap smear done 12/08/15, negative and HPV neg, repeat in 5 years.   Plan:    1. Contraception: abstinence and LARC is being considered when ready to be sexually active again and when finds a partner 2. Back pain 2/2  positional from breastfeeding. Demonstrated positions for breastfeeding  3. Follow up in: 1-3 week if decides to start birth control or as needed.

## 2016-04-05 NOTE — Patient Instructions (Signed)
Etonogestrel implant What is this medicine? ETONOGESTREL (et oh noe JES trel) is a contraceptive (birth control) device. It is used to prevent pregnancy. It can be used for up to 3 years. This medicine may be used for other purposes; ask your health care provider or pharmacist if you have questions. What should I tell my health care provider before I take this medicine? They need to know if you have any of these conditions: -abnormal vaginal bleeding -blood vessel disease or blood clots -cancer of the breast, cervix, or liver -depression -diabetes -gallbladder disease -headaches -heart disease or recent heart attack -high blood pressure -high cholesterol -kidney disease -liver disease -renal disease -seizures -tobacco smoker -an unusual or allergic reaction to etonogestrel, other hormones, anesthetics or antiseptics, medicines, foods, dyes, or preservatives -pregnant or trying to get pregnant -breast-feeding How should I use this medicine? This device is inserted just under the skin on the inner side of your upper arm by a health care professional. Talk to your pediatrician regarding the use of this medicine in children. Special care may be needed. Overdosage: If you think you have taken too much of this medicine contact a poison control center or emergency room at once. NOTE: This medicine is only for you. Do not share this medicine with others. What if I miss a dose? This does not apply. What may interact with this medicine? Do not take this medicine with any of the following medications: -amprenavir -bosentan -fosamprenavir This medicine may also interact with the following medications: -barbiturate medicines for inducing sleep or treating seizures -certain medicines for fungal infections like ketoconazole and itraconazole -griseofulvin -medicines to treat seizures like carbamazepine, felbamate, oxcarbazepine, phenytoin,  topiramate -modafinil -phenylbutazone -rifampin -some medicines to treat HIV infection like atazanavir, indinavir, lopinavir, nelfinavir, tipranavir, ritonavir -St. John's wort This list may not describe all possible interactions. Give your health care provider a list of all the medicines, herbs, non-prescription drugs, or dietary supplements you use. Also tell them if you smoke, drink alcohol, or use illegal drugs. Some items may interact with your medicine. What should I watch for while using this medicine? This product does not protect you against HIV infection (AIDS) or other sexually transmitted diseases. You should be able to feel the implant by pressing your fingertips over the skin where it was inserted. Contact your doctor if you cannot feel the implant, and use a non-hormonal birth control method (such as condoms) until your doctor confirms that the implant is in place. If you feel that the implant may have broken or become bent while in your arm, contact your healthcare provider. What side effects may I notice from receiving this medicine? Side effects that you should report to your doctor or health care professional as soon as possible: -allergic reactions like skin rash, itching or hives, swelling of the face, lips, or tongue -breast lumps -changes in emotions or moods -depressed mood -heavy or prolonged menstrual bleeding -pain, irritation, swelling, or bruising at the insertion site -scar at site of insertion -signs of infection at the insertion site such as fever, and skin redness, pain or discharge -signs of pregnancy -signs and symptoms of a blood clot such as breathing problems; changes in vision; chest pain; severe, sudden headache; pain, swelling, warmth in the leg; trouble speaking; sudden numbness or weakness of the face, arm or leg -signs and symptoms of liver injury like dark yellow or brown urine; general ill feeling or flu-like symptoms; light-colored stools; loss of  appetite; nausea; right upper belly   pain; unusually weak or tired; yellowing of the eyes or skin -unusual vaginal bleeding, discharge -signs and symptoms of a stroke like changes in vision; confusion; trouble speaking or understanding; severe headaches; sudden numbness or weakness of the face, arm or leg; trouble walking; dizziness; loss of balance or coordination Side effects that usually do not require medical attention (Report these to your doctor or health care professional if they continue or are bothersome.): -acne -back pain -breast pain -changes in weight -dizziness -general ill feeling or flu-like symptoms -headache -irregular menstrual bleeding -nausea -sore throat -vaginal irritation or inflammation This list may not describe all possible side effects. Call your doctor for medical advice about side effects. You may report side effects to FDA at 1-800-FDA-1088. Where should I keep my medicine? This drug is given in a hospital or clinic and will not be stored at home. NOTE: This sheet is a summary. It may not cover all possible information. If you have questions about this medicine, talk to your doctor, pharmacist, or health care provider.    2016, Elsevier/Gold Standard. (2014-02-22 14:07:06)  

## 2017-12-03 IMAGING — US US MFM OB DETAIL+14 WK
1 series · 13 of 28 positions shown · non-contrast
Comparison: none

[Series 1: us mfm ob detail+14 wk · 115 acquisitions, 13 frames shown]
[im 5/115]
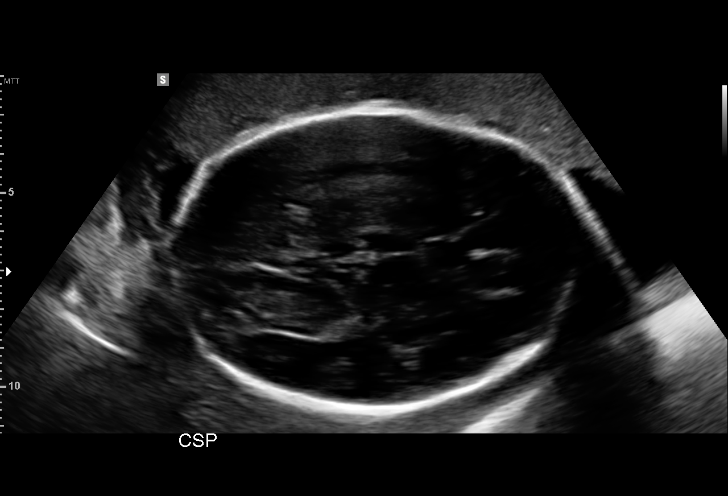
[im 13/115]
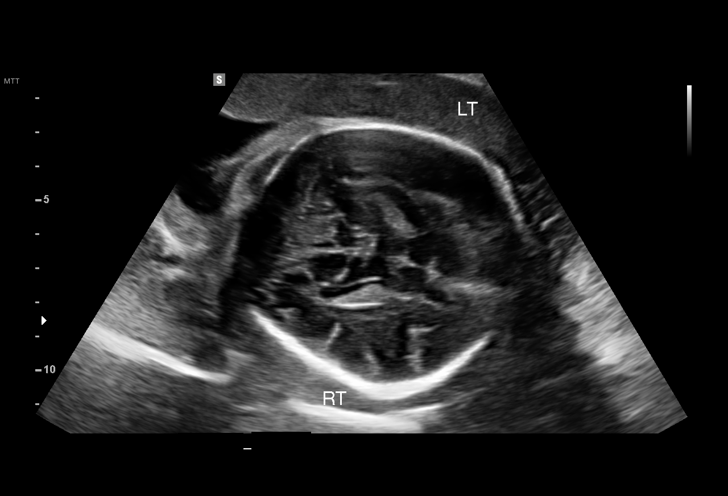
[im 22/115]
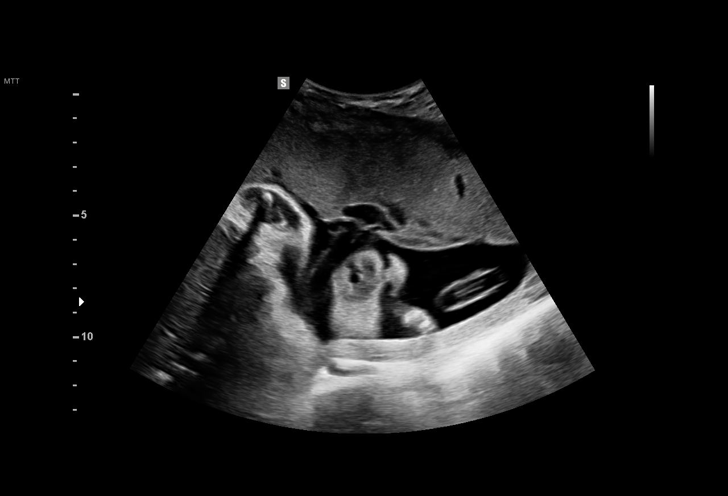
[im 30/115]
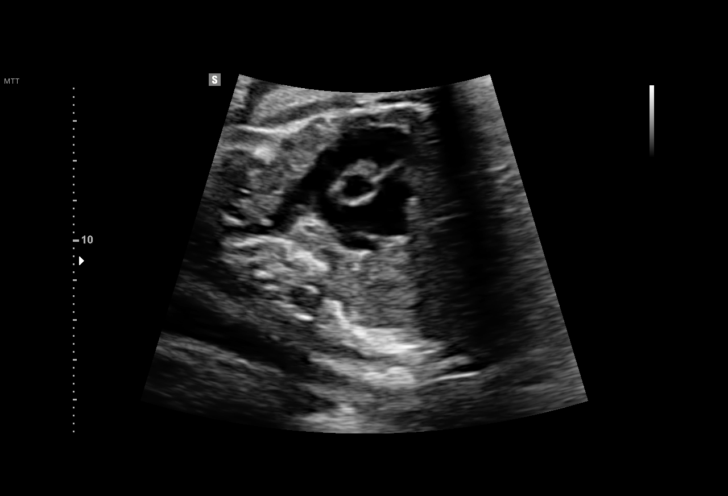
[im 39/115]
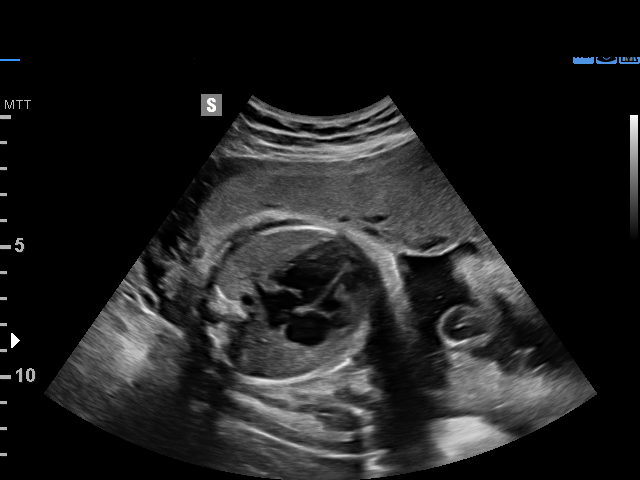
[im 47/115]
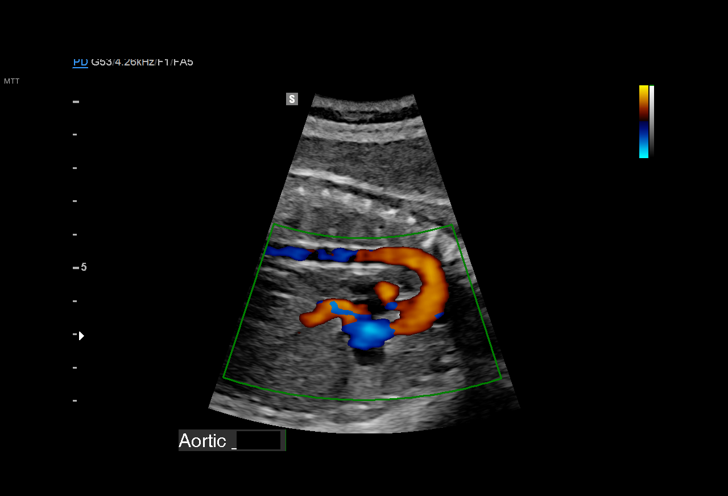
[im 60/115]
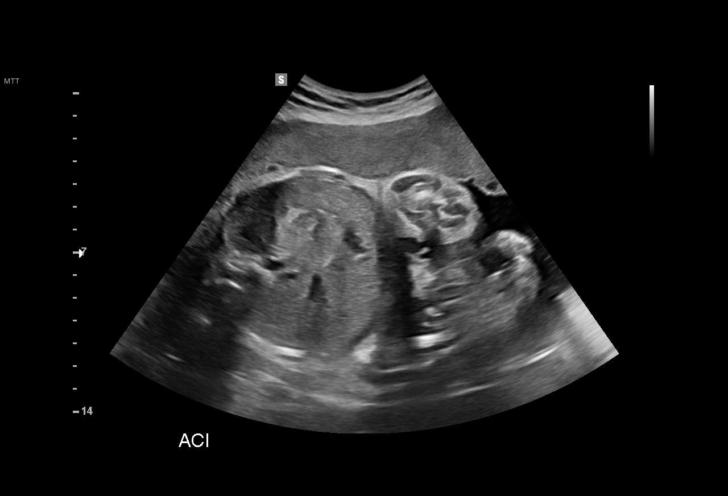
[im 68/115]
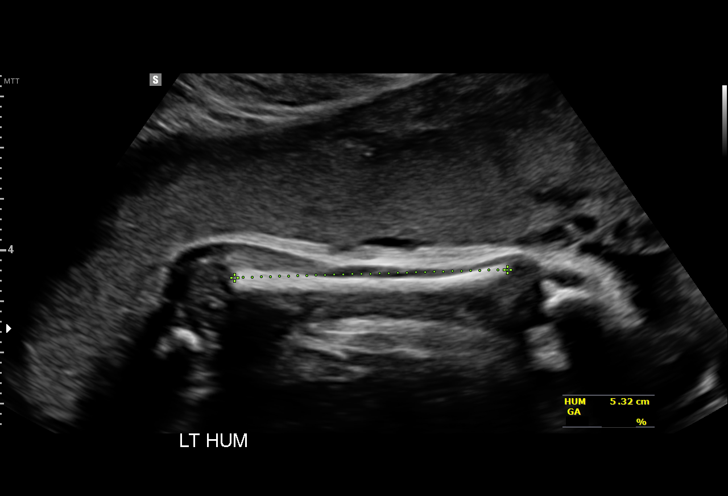
[im 77/115]
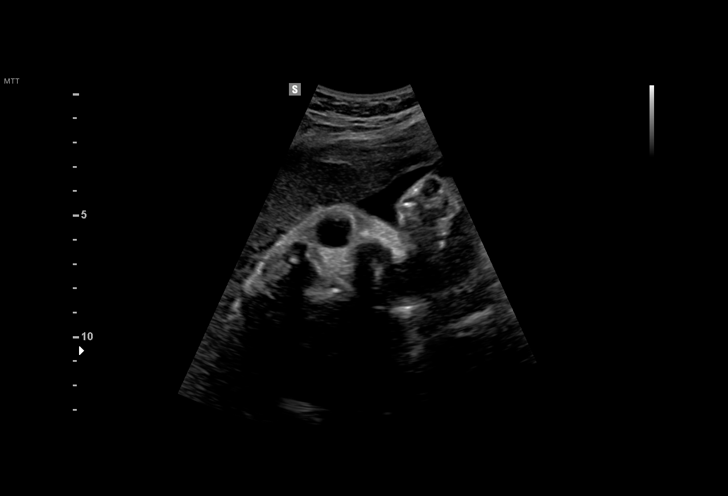
[im 85/115]
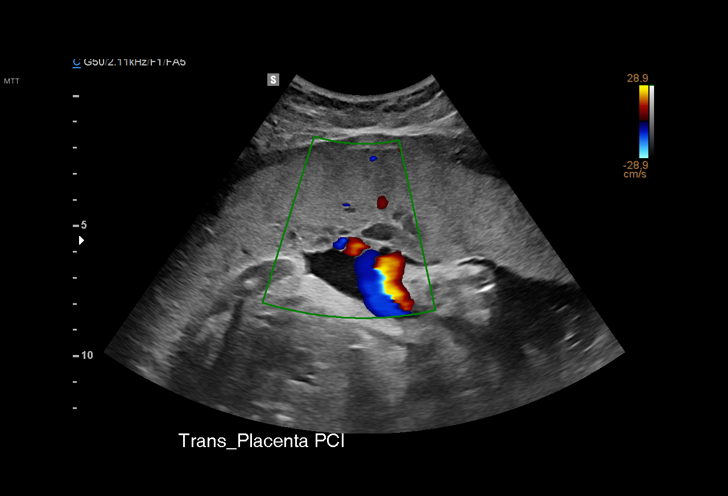
[im 93/115]
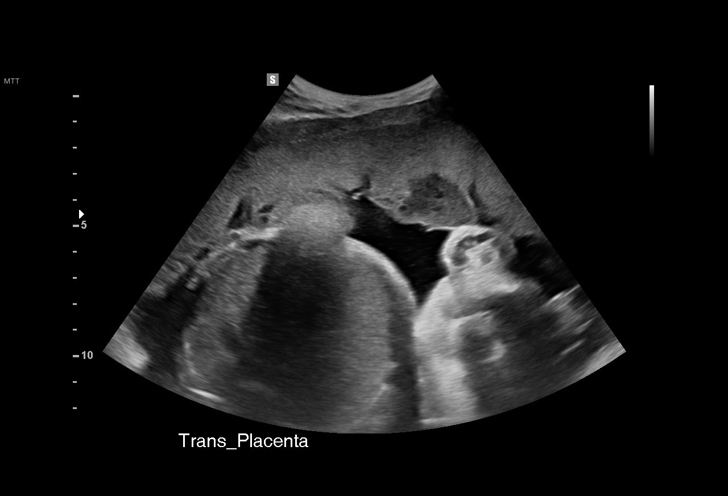
[im 102/115]
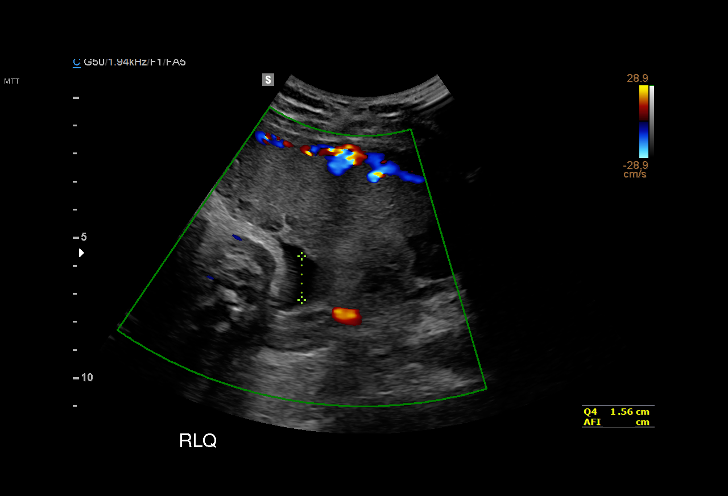
[im 110/115]
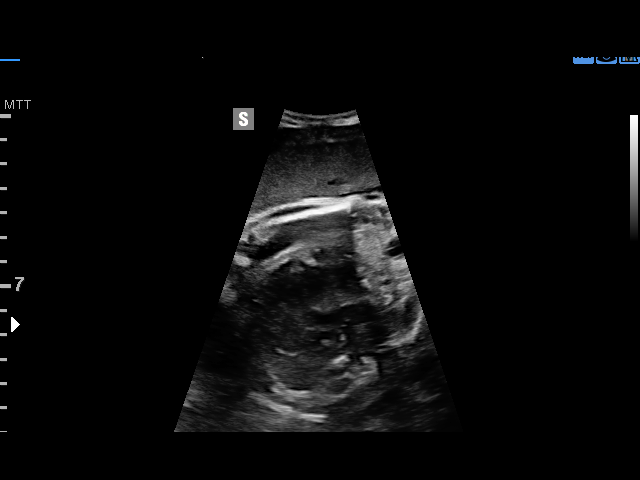

[13 of 28 positions shown; findings below may reference images not displayed]

am)

[facility] Clinic
Attending:        Lizmarie Dahlberg        Secondary Phy.:   DEEQA RAYAAN ADLAHO
WHITING
Jevan Tiger
Faculty Physician

1  LENA-MARIA TAYLAN                [phone]      [phone]     [phone]
Indications

31 weeks gestation of pregnancy
Detailed fetal anatomic survey                 Z36
Late to prenatal care, third trimester
Hepatitis B complicating pregnancy             O98.419,
Advanced maternal age multigravida 35+,
third trimester
OB History

Blood Type:            Height:         Weight (lb):  168      BMI:
Gravidity:    6         Term:   5        Prem:   0        SAB:   0
TOP:          0       Ectopic:  0        Living: 5
Fetal Evaluation

Num Of Fetuses:     1
Fetal Heart         119
Rate(bpm):
Cardiac Activity:   Observed
Presentation:       Cephalic
Placenta:           Anterior, above cervical os
P. Cord Insertion:  Visualized, central

Amniotic Fluid
AFI FV:      Subjectively within normal limits

AFI Sum(cm)     %Tile       Largest Pocket(cm)
10.82           22
RUQ(cm)       RLQ(cm)       LUQ(cm)        LLQ(cm)
3.99
Biometry

BPD:      75.4  mm     G. Age:  30w 2d          6  %    CI:         69.1   %   70 - 86
FL/HC:      20.2   %   19.1 -
HC:      289.7  mm     G. Age:  31w 6d         17  %    HC/AC:      1.00       0.96 -
AC:      289.3  mm     G. Age:  33w 0d         78  %    FL/BPD:     77.5   %   71 - 87
FL:       58.4  mm     G. Age:  30w 4d          9  %    FL/AC:      20.2   %   20 - 24
HUM:        53  mm     G. Age:  30w 6d         31  %
CER:      39.3  mm     G. Age:  33w 2d         73  %

CM:        7.4  mm
Est. FW:    0653  gm      4 lb 2 oz     57  %
Gestational Age

LMP:           31w 6d       Date:   05/06/15                 EDD:   02/10/16
U/S Today:     31w 3d                                        EDD:   02/13/16
Best:          31w 6d    Det. By:   LMP  (05/06/15)          EDD:   02/10/16
Anatomy

Cranium:               Appears normal         LVOT:                   Appears normal
Cavum:                 Appears normal         Aortic Arch:            Appears normal
Ventricles:            Appears normal         Ductal Arch:            Appears normal
Choroid Plexus:        Appears normal         Diaphragm:              Appears normal
Cerebellum:            Appears normal         Stomach:                Appears normal, left
sided
Posterior Fossa:       Appears normal         Abdomen:                Appears normal
Nuchal Fold:           Not applicable (>20    Abdominal Wall:         Appears nml (cord
wks GA)                                        insert, abd wall)
Face:                  Appears normal         Cord Vessels:           Appears normal (3
(orbits and profile)                           vessel cord)
Lips:                  Appears normal         Kidneys:                Appear normal
Palate:                Not well visualized    Bladder:                Appears normal
Thoracic:              Appears normal         Spine:                  Not well visualized
Heart:                 Appears normal         Upper Extremities:      Visualized
(4CH, axis, and situs
RVOT:                  Appears normal         Lower Extremities:      Visualized

Other:  Fetus appears to be a male. Nasal bone visualized. Technically
difficult due to advanced GA and fetal position.
Cervix Uterus Adnexa

Cervix
Not visualized (advanced GA >44wks)

Uterus
No abnormality visualized.

Left Ovary
Size(cm)     2.31  x    2.05   x  1.97      Vol(ml):
Within normal limits. No adnexal mass visualized.

Right Ovary
Size(cm)     2.09  x    1.97   x  1.37      Vol(ml): 3
Within normal limits. No adnexal mass visualized.
Cul De Sac:   No free fluid seen.
Adnexa:       No abnormality visualized.
Impression

Single IUP at 31w 6d
Late prenatal care, Hepatitis B, advanced maternal age
Normal fetal anatomic survey; however somewhat limited due
to advanced gestational age
The estimated fetal weight is at the 57th %tile
Anterior placenta without previa
Normal amniotic fluid volume
Recommendations

Consider follow up ultrasound in 4 weeks for growth due to
late onset of care and other comorbidities
